# Patient Record
Sex: Male | Born: 1980 | Race: Black or African American | Hispanic: No | Marital: Married | Smoking: Never smoker
Health system: Southern US, Community
[De-identification: ages and names within clinical notes are randomized; demographics above are authoritative.]

## PROBLEM LIST (undated history)

## (undated) DIAGNOSIS — I10 Essential (primary) hypertension: Secondary | ICD-10-CM

## (undated) DIAGNOSIS — E785 Hyperlipidemia, unspecified: Secondary | ICD-10-CM

## (undated) HISTORY — DX: Hyperlipidemia, unspecified: E78.5

---
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
## 2012-08-04 ENCOUNTER — Encounter (HOSPITAL_COMMUNITY): Payer: Self-pay

## 2012-08-04 ENCOUNTER — Emergency Department (HOSPITAL_COMMUNITY)
Admission: EM | Admit: 2012-08-04 | Discharge: 2012-08-04 | Disposition: A | Discharge status: Home / Self Care | Payer: Self-pay | Attending: Emergency Medicine | Admitting: Emergency Medicine

## 2012-08-04 DIAGNOSIS — Y939 Activity, unspecified: Secondary | ICD-10-CM | POA: Insufficient documentation

## 2012-08-04 DIAGNOSIS — S20229A Contusion of unspecified back wall of thorax, initial encounter: Secondary | ICD-10-CM | POA: Insufficient documentation

## 2012-08-04 DIAGNOSIS — Y92009 Unspecified place in unspecified non-institutional (private) residence as the place of occurrence of the external cause: Secondary | ICD-10-CM | POA: Insufficient documentation

## 2012-08-04 DIAGNOSIS — W108XXA Fall (on) (from) other stairs and steps, initial encounter: Secondary | ICD-10-CM | POA: Insufficient documentation

## 2012-08-04 MED ORDER — DIAZEPAM 5 MG PO TABS: 5.0000 mg | ORAL_TABLET | Freq: Two times a day (BID) | ORAL | Status: DC

## 2012-08-04 NOTE — ED Provider Notes (Signed)
History     CSN: [id number]  Arrival date & time 08/04/12  1754   First MD Initiated Contact with Patient 08/04/12 2029      Chief Complaint  Patient presents with  . Back Pain  . Fall    (Consider location/radiation/quality/duration/timing/severity/associated sxs/prior treatment) HPI History provided by pt.   Pt slipped on wet carpet on his stairs at home yesterday afternoon, landed on his back and fell down approximately 10 steps.  Did not hit his head.  C/o soreness right mid-back.  Aggravated by movement and alleviated by valium that his wife gave him.  Non-pleuritic and no associated SOB, extremity weakness or paresthesias.   History reviewed. No pertinent past medical history.  History reviewed. No pertinent past surgical history.  History reviewed. No pertinent family history.  History  Substance Use Topics  . Smoking status: Never Smoker   . Smokeless tobacco: Not on file  . Alcohol Use: No      Review of Systems  All other systems reviewed and are negative.    Allergies  Review of patient's allergies indicates no known allergies.  Home Medications   Current Outpatient Rx  Name  Route  Sig  Dispense  Refill  . OVER THE COUNTER MEDICATION   Oral   Take 1 tablet by mouth daily as needed. For pain, muscle relaxer         . DIAZEPAM 5 MG PO TABS   Oral   Take 1 tablet (5 mg total) by mouth 2 (two) times daily.   10 tablet   0     BP 151/103  Pulse 68  Temp 98.6 F (37 C) (Oral)  Resp 18  SpO2 99%  Physical Exam  Nursing note and vitals reviewed. Constitutional: He is oriented to person, place, and time. He appears well-developed and well-nourished. No distress.  HENT:  Head: Normocephalic and atraumatic.  Eyes:       Normal appearance  Neck: Normal range of motion.  Cardiovascular: Normal rate and regular rhythm.   Pulmonary/Chest: Effort normal and breath sounds normal. No respiratory distress.  Musculoskeletal: Normal range of motion.        Entire spine non-tender.  Mild tenderness to deep palpation just inferior to right scapula.  Patient reports pain w/ movement of torso.  No pain w/ passive ROM of RUE.  Full ROM and NV intact upper and lower extremities.   Neurological: He is alert and oriented to person, place, and time.  Skin: Skin is warm and dry. No rash noted.       No ecchymosis or abrasions  Psychiatric: He has a normal mood and affect. His behavior is normal.    ED Course  Procedures (including critical care time)  Labs Reviewed - No data to display No results found.   1. Back contusion       MDM  31yo M had a mechanical fall on stairs yesterday and presents w/ mid-back soreness.  Pain non-pleuritic, no SOB, no spinal tenderness, active ROM and NV intact in all 4 extremities.  Will treat symptomatically for contusion/sprain.  Pt prescribed 10 valium.  He has 800mg  ibuprofen at home.  Recommended rest and heat/ice as well.  Return precautions discussed. 8:54 PM         Otilio Miu, PA-C 08/04/12 2054

## 2012-08-04 NOTE — ED Notes (Signed)
Pt c/o mid back pain after falling down stairs yesterday; pt denies LOC or other complaint

## 2012-08-04 NOTE — ED Notes (Signed)
Called for room  No answer  

## 2012-08-04 NOTE — ED Notes (Signed)
PA at bedside.

## 2012-08-05 NOTE — ED Provider Notes (Signed)
Medical screening examination/treatment/procedure(s) were performed by non-physician practitioner and as supervising physician I was immediately available for consultation/collaboration.  Raeford Razor, MD 08/05/12 [phone]

## 2013-01-08 ENCOUNTER — Encounter (HOSPITAL_COMMUNITY): Payer: Self-pay | Admitting: *Deleted

## 2013-01-08 ENCOUNTER — Emergency Department (INDEPENDENT_AMBULATORY_CARE_PROVIDER_SITE_OTHER)
Admission: EM | Admit: 2013-01-08 | Discharge: 2013-01-08 | Disposition: A | Discharge status: Home / Self Care | Payer: Self-pay | Source: Home / Self Care | Attending: Family Medicine | Admitting: Family Medicine

## 2013-01-08 DIAGNOSIS — M7660 Achilles tendinitis, unspecified leg: Secondary | ICD-10-CM

## 2013-01-08 DIAGNOSIS — M7661 Achilles tendinitis, right leg: Secondary | ICD-10-CM

## 2013-01-08 HISTORY — DX: Essential (primary) hypertension: I10

## 2013-01-08 MED ORDER — IBUPROFEN 600 MG PO TABS: 600.0000 mg | ORAL_TABLET | Freq: Three times a day (TID) | ORAL | Status: DC

## 2013-01-08 MED ORDER — TRAMADOL HCL 50 MG PO TABS: 50.0000 mg | ORAL_TABLET | Freq: Three times a day (TID) | ORAL | Status: DC | PRN

## 2013-01-08 NOTE — ED Notes (Signed)
Pt   Reports   r   Heel  Pain         For  sev  Months  Getting  Worse               denys  Any  specefic      Injury          The  Heel  Is  Tender  To  Touch  And  Exhibits  Pain  On  Weight  Bearing

## 2013-01-08 NOTE — ED Provider Notes (Signed)
   History    CSN: [id number] Arrival date & time 01/08/13  1004  First MD Initiated Contact with Patient 01/08/13 1050     Chief Complaint  Patient presents with  . Foot Pain   (Consider location/radiation/quality/duration/timing/severity/associated sxs/prior Treatment) HPI Comments:  32 year old male here complaining of right heel pain intermittently for the last 3 months. Patient reports his pain is worse in the last few days. States he can barely lift his foot while walking or standing. 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 Denies direct injury or falls. No prior similar symptoms. Takes over-the-counter medications intermittently with moderate improvement. Denies leg or foot swelling. No antibiotic treatments prior his symptoms started.    Past Medical History  Diagnosis Date  . Hypertension    History reviewed. No pertinent past surgical history. No family history on file. History  Substance Use Topics  . Smoking status: Never Smoker   . Smokeless tobacco: Not on file  . Alcohol Use: No    Review of Systems  Constitutional: Negative for fever, chills and fatigue.  Musculoskeletal:       As per HPI  Skin: Negative for wound.  All other systems reviewed and are negative.    Allergies  Review of patient's allergies indicates no known allergies.  Home Medications   Current Outpatient Rx  Name  Route  Sig  Dispense  Refill  . diazepam (VALIUM) 5 MG tablet   Oral   Take 1 tablet (5 mg total) by mouth 2 (two) times daily.   10 tablet   0   . ibuprofen (ADVIL,MOTRIN) 600 MG tablet   Oral   Take 1 tablet (600 mg total) by mouth 3 (three) times daily.   30 tablet   0   . OVER THE COUNTER MEDICATION   Oral   Take 1 tablet by mouth daily as needed. For pain, muscle relaxer         . traMADol (ULTRAM) 50 MG tablet   Oral   Take 1 tablet (50 mg total) by mouth every 8 (eight) hours as needed for pain.   20  tablet   0    BP 124/76  Pulse 70  Temp(Src) 97.9 F (36.6 C) (Oral)  Resp 16  SpO2 100% Physical Exam  Nursing note and vitals reviewed. Constitutional: He is oriented to person, place, and time. He appears well-developed and well-nourished. No distress.  HENT:  Head: Normocephalic and atraumatic.  Cardiovascular: Normal heart sounds.   Pulmonary/Chest: Breath sounds normal.  Musculoskeletal:  Right foot: No swelling or obvious deformity. There is focal tenderness over achilles tendon about 6 cm above heel tuberosity. Achilles tendon feels slightly bulky. No nodules or cysts. Negative Thompson's sign. Pain worse with foot dorsiflexion.    Neurological: He is alert and oriented to person, place, and time.  Skin: No rash noted. He is not diaphoretic.    ED Course  Procedures (including critical care time) Labs Reviewed - No data to display No results found. 1. Achilles tendinitis, right     MDM  Treated with Achilles tendon immobilization with Ace wrap and ankle brace. Prescribed Ibuprofen and tramadol. Supportive care including rehabilitation exercises and red flags that should prompt his return to medical attention discussed with patient and provided in writing. Sports medicine referral as needed.  Sharin Grave, MD 01/09/13 [phone]

## 2013-01-12 ENCOUNTER — Ambulatory Visit (INDEPENDENT_AMBULATORY_CARE_PROVIDER_SITE_OTHER): Payer: Self-pay | Admitting: Family Medicine

## 2013-01-12 ENCOUNTER — Encounter: Payer: Self-pay | Admitting: Family Medicine

## 2013-01-12 ENCOUNTER — Other Ambulatory Visit: Payer: Self-pay | Admitting: Family Medicine

## 2013-01-12 VITALS — BP 128/90 | HR 74 | Ht 70.0 in | Wt 215.0 lb

## 2013-01-12 DIAGNOSIS — M7661 Achilles tendinitis, right leg: Secondary | ICD-10-CM

## 2013-01-12 DIAGNOSIS — M7660 Achilles tendinitis, unspecified leg: Secondary | ICD-10-CM

## 2013-01-12 MED ORDER — NITROGLYCERIN 0.2 MG/HR TD PT24: MEDICATED_PATCH | TRANSDERMAL | Status: DC

## 2013-01-12 NOTE — Patient Instructions (Addendum)
You have achilles tendinopathy Aleve 2 tabs twice a day with food OR ibuprofen 3 tabs three times a day with food for pain and inflammation. Lowering/raise on a step exercises 3 x 10 once or twice a day - start doing them on level ground first though. Stretch against the wall as I showed you - hold for 20 seconds and repeat 3 times. Can add heel walks, toe walks forward and backward as well Icing 15 minutes at a time 3-4 times a day. Avoid uneven ground, hills as much as possible. Heel lifts are helpful. Consider orthotics (dr. Jari Sportsman active series insoles) for arch supports which may help. Consider physical therapy and/or nitro patches if not improving as expected Follow up with me in 5-6 weeks for reevaluation.

## 2013-01-13 ENCOUNTER — Encounter: Payer: Self-pay | Admitting: Family Medicine

## 2013-01-13 DIAGNOSIS — M7660 Achilles tendinitis, unspecified leg: Secondary | ICD-10-CM | POA: Insufficient documentation

## 2013-01-13 NOTE — Progress Notes (Signed)
Patient ID: Ian Thomas, male   DOB: Oct 13, 1980, 32 y.o.   MRN: [id number]  PCP: No PCP Per Patient  Subjective:   HPI: Patient is a 32 y.o. male here for right ankle pain.  Patient denies known injury. States about 3 months ago at work started to notice posterior right ankle pain. Some radiation up leg as well. Works 10-12 hours a day on his feet. Worse by end of day. Tried icing. In [facility] was given postop shoe, ace wrap, ASO which he has been using. Taking ibuprofen as well. No prior issues.  Past Medical History  Diagnosis Date  . Hypertension     Current Outpatient Prescriptions on File Prior to Visit  Medication Sig Dispense Refill  . ibuprofen (ADVIL,MOTRIN) 600 MG tablet Take 1 tablet (600 mg total) by mouth 3 (three) times daily.  30 tablet  0  . traMADol (ULTRAM) 50 MG tablet Take 1 tablet (50 mg total) by mouth every 8 (eight) hours as needed for pain.  20 tablet  0   No current facility-administered medications on file prior to visit.    History reviewed. No pertinent past surgical history.  No Known Allergies  History   Social History  . Marital Status: Married    Spouse Name: N/A    Number of Children: N/A  . Years of Education: N/A   Occupational History  . Not on file.   Social History Main Topics  . Smoking status: Never Smoker   . Smokeless tobacco: Not on file  . Alcohol Use: No  . Drug Use: No  . Sexually Active: Not on file   Other Topics Concern  . Not on file   Social History Narrative  . No narrative on file    Family History  Problem Relation Age of Onset  . Hypertension Mother   . Hypertension Father   . Diabetes Sister   . Heart attack Neg Hx   . Hyperlipidemia Neg Hx   . Sudden death Neg Hx     BP 128/90  Pulse 74  Ht 5\' 10"  (1.778 m)  Wt 215 lb (97.523 kg)  BMI 30.85 kg/m2  Review of Systems: See HPI above.    Objective:  Physical Exam:  Gen: NAD  R ankle: No gross deformity, swelling, ecchymoses FROM  ankle with pain in achilles with all motions TTP at achilles insertion up to musculotendinous junction.  No PF, malleolar, other foot/ankle TTP. Negative ant drawer and talar tilt.   Negative syndesmotic compression. Thompsons test negative. NV intact distally. Pes planus  Assessment & Plan:  1. Right achilles tendinopathy - start home exercises which were shown today.  He would like to do formal physical therapy as well so written for this.  Icing, tylenol/nsaids as needed.  Avoid uneven ground, hills.  Heel lifts and better arch supports.  Out of work for 1 week then placed on light duty until f/u in about 6 weeks.

## 2013-01-13 NOTE — Assessment & Plan Note (Signed)
Right achilles tendinopathy - start home exercises which were shown today.  He would like to do formal physical therapy as well so written for this.  Icing, tylenol/nsaids as needed.  Avoid uneven ground, hills.  Heel lifts and better arch supports.  Out of work for 1 week then placed on light duty until f/u in about 6 weeks.

## 2013-01-15 ENCOUNTER — Encounter: Payer: Self-pay | Admitting: Family Medicine

## 2014-04-20 ENCOUNTER — Encounter (HOSPITAL_COMMUNITY): Payer: Self-pay | Admitting: Emergency Medicine

## 2014-04-20 ENCOUNTER — Emergency Department (HOSPITAL_COMMUNITY)
Admission: EM | Admit: 2014-04-20 | Discharge: 2014-04-20 | Disposition: A | Discharge status: Home / Self Care | Payer: Self-pay | Attending: Emergency Medicine | Admitting: Emergency Medicine

## 2014-04-20 DIAGNOSIS — R51 Headache: Secondary | ICD-10-CM | POA: Insufficient documentation

## 2014-04-20 DIAGNOSIS — I10 Essential (primary) hypertension: Secondary | ICD-10-CM

## 2014-04-20 DIAGNOSIS — R519 Headache, unspecified: Secondary | ICD-10-CM

## 2014-04-20 DIAGNOSIS — Z79899 Other long term (current) drug therapy: Secondary | ICD-10-CM | POA: Insufficient documentation

## 2014-04-20 LAB — CBC WITH DIFFERENTIAL/PLATELET
BASOS PCT: 1 % (ref 0–1)
Basophils Absolute: 0 10*3/uL (ref 0.0–0.1)
EOS ABS: 0.1 10*3/uL (ref 0.0–0.7)
Eosinophils Relative: 2 % (ref 0–5)
HCT: 41.5 % (ref 39.0–52.0)
HEMOGLOBIN: 14.6 g/dL (ref 13.0–17.0)
Lymphocytes Relative: 31 % (ref 12–46)
Lymphs Abs: 1.1 10*3/uL (ref 0.7–4.0)
MCH: 31.9 pg (ref 26.0–34.0)
MCHC: 35.2 g/dL (ref 30.0–36.0)
MCV: 90.6 fL (ref 78.0–100.0)
MONOS PCT: 10 % (ref 3–12)
Monocytes Absolute: 0.3 10*3/uL (ref 0.1–1.0)
NEUTROS ABS: 1.9 10*3/uL (ref 1.7–7.7)
NEUTROS PCT: 56 % (ref 43–77)
PLATELETS: 177 10*3/uL (ref 150–400)
RBC: 4.58 MIL/uL (ref 4.22–5.81)
RDW: 13.5 % (ref 11.5–15.5)
WBC: 3.4 10*3/uL — ABNORMAL LOW (ref 4.0–10.5)

## 2014-04-20 LAB — BASIC METABOLIC PANEL
ANION GAP: 11 (ref 5–15)
BUN: 7 mg/dL (ref 6–23)
CHLORIDE: 99 meq/L (ref 96–112)
CO2: 27 mEq/L (ref 19–32)
Calcium: 9.5 mg/dL (ref 8.4–10.5)
Creatinine, Ser: 0.97 mg/dL (ref 0.50–1.35)
Glucose, Bld: 96 mg/dL (ref 70–99)
POTASSIUM: 4.1 meq/L (ref 3.7–5.3)
SODIUM: 137 meq/L (ref 137–147)

## 2014-04-20 MED ORDER — AMLODIPINE BESYLATE 10 MG PO TABS: 10.0000 mg | ORAL_TABLET | Freq: Every day | ORAL | Status: DC

## 2014-04-20 MED ORDER — AMLODIPINE BESYLATE 10 MG PO TABS
10.0000 mg | ORAL_TABLET | Freq: Once | ORAL | Status: AC
  Administered 2014-04-20: 10 mg via ORAL
  Filled 2014-04-20: qty 1

## 2014-04-20 MED ORDER — IBUPROFEN 200 MG PO TABS
600.0000 mg | ORAL_TABLET | Freq: Once | ORAL | Status: AC
  Administered 2014-04-20: 600 mg via ORAL
  Filled 2014-04-20: qty 3

## 2014-04-20 NOTE — Discharge Instructions (Signed)

## 2014-04-20 NOTE — ED Notes (Signed)
Pt c/o headache; history of hypertension; pt has been out of amlodipine x 1 month due to recent move and not having a primary care physician;  Pt states at times has blurred vision and nausea at times

## 2014-04-20 NOTE — ED Notes (Signed)
Pt reports blurred vision and nausea yesterday. Pt reports headache of temporal region bilaterally at present time. Pt hx of hypertension without medication regimen over past month. Pt neuro unremarkable.

## 2014-04-20 NOTE — ED Provider Notes (Signed)
CSN: [id number]     Arrival date & time 04/20/14  1202 History   First MD Initiated Contact with Patient 04/20/14 1256     Chief Complaint  Patient presents with  . Hypertension     (Consider location/radiation/quality/duration/timing/severity/associated sxs/prior Treatment) HPI  This is a 33 year old male with a history of hypertension who presents with hypertension and headache. Patient reports that he's been out of his blood pressure medications for one month. He is new to the area and does not have primary Dr. Patient normally takes amlodipine 10 mg daily. He states that his blood pressures at home have been as high as 195/117. He states that when his blood pressures get high he develops headache and blurry vision. Headaches come on gradually. Currently headache is 8/10. He denies acute onset of headache. At this time he denies any vision changes. He denies any weakness, numbness, tingling. He denies any neck this or fevers. He denies any chest pain or shortness of breath.  Past Medical History  Diagnosis Date  . Hypertension    History reviewed. No pertinent past surgical history. Family History  Problem Relation Age of Onset  . Hypertension Mother   . Hypertension Father   . Diabetes Sister   . Heart attack Neg Hx   . Hyperlipidemia Neg Hx   . Sudden death Neg Hx    History  Substance Use Topics  . Smoking status: Never Smoker   . Smokeless tobacco: Not on file  . Alcohol Use: No    Review of Systems  Constitutional: Negative.  Negative for fever.  Respiratory: Negative.  Negative for chest tightness and shortness of breath.   Cardiovascular: Negative.  Negative for chest pain.  Gastrointestinal: Negative.  Negative for abdominal pain.  Genitourinary: Negative.  Negative for dysuria.  Neurological: Positive for headaches. Negative for dizziness, seizures, weakness and light-headedness.  All other systems reviewed and are negative.     Allergies  Review of  patient's allergies indicates no known allergies.  Home Medications   Prior to Admission medications   Medication Sig Start Date End Date Taking? Authorizing Provider  ibuprofen (ADVIL,MOTRIN) 800 MG tablet Take 800 mg by mouth every 8 (eight) hours as needed for headache or moderate pain.   Yes Historical Provider, MD  amLODipine (NORVASC) 10 MG tablet Take 1 tablet (10 mg total) by mouth daily. 04/20/14   Shon Batonourtney F Tyniesha Howald, MD   BP 169/101  Pulse 76  Temp(Src) 98.1 F (36.7 C) (Oral)  Resp 16  SpO2 100% Physical Exam  Nursing note and vitals reviewed. Constitutional: He is oriented to person, place, and time. He appears well-developed and well-nourished.  HENT:  Head: Normocephalic and atraumatic.  Eyes: EOM are normal. Pupils are equal, round, and reactive to light.  Neck: Neck supple.  Cardiovascular: Normal rate, regular rhythm and normal heart sounds.   No murmur heard. Pulmonary/Chest: Effort normal and breath sounds normal. No respiratory distress. He has no wheezes.  Abdominal: Soft. Bowel sounds are normal. There is no tenderness. There is no rebound.  Musculoskeletal: He exhibits no edema.  Neurological: He is alert and oriented to person, place, and time.  Skin: Skin is warm and dry.  Psychiatric: He has a normal mood and affect.    ED Course  Procedures (including critical care time) Labs Review Labs Reviewed  CBC WITH DIFFERENTIAL - Abnormal; Notable for the following:    WBC 3.4 (*)    All other components within normal limits  BASIC METABOLIC PANEL  Imaging Review No results found.   EKG Interpretation   Date/Time:  Tuesday April 20 2014 13:45:37 EDT Ventricular Rate:  80 PR Interval:  159 QRS Duration: 79 QT Interval:  351 QTC Calculation: 405 R Axis:   57 Text Interpretation:  Sinus rhythm Probable left atrial enlargement  Borderline T abnormalities, inferior leads No prior for comparison  Confirmed by Lorenda Grecco  MD, Shaelee Forni ([id number]) on  04/20/2014 7:03:46 PM      MDM   Final diagnoses:  Essential hypertension  Acute nonintractable headache, unspecified headache type   Patient presents with hypertension. Reports episodic headaches when blood pressures get high. He has been off his blood pressure medications. States that his headache is typical for his associated blood pressure headaches. He is nonfocal on exam. Doubt subarachnoid or infectious etiology. Patient was given one dose of amlodipine and ibuprofen. Reports improvement of symptoms. Basic labwork and EKG obtained and largely reassuring. We'll place back on prior dose of amlodipine. Patient was given [facility] followup.  No evidence of hypertensive urgency or emergency at this time.  After history, exam, and medical workup I feel the patient has been appropriately medically screened and is safe for discharge home. Pertinent diagnoses were discussed with the patient. Patient was given return precautions.    Shon Batonourtney F Dulcy Sida, MD 04/21/14 [phone]

## 2014-04-20 NOTE — Progress Notes (Signed)
  CARE MANAGEMENT ED NOTE 04/20/2014  Patient:  Ian NasutiGRIFFIN,Ian   Account Number:  [id number]  Date Initiated:  04/20/2014  Documentation initiated by:  Edd ArbourGIBBS,KIMBERLY  Subjective/Objective Assessment:   33 yr [place] wife at bedside c/o headache; history of hypertension; pt has been out of amlodipine x 1 month due to recent move and not having a primary care physician;  Pt states at times has blurred     Subjective/Objective Assessment Detail:   vision and nausea at times  confirms no pcp  Pt very pleasant and polite  BP 175/100  good rx self pay cost for norvasc 10 mg 30 day supply $6.64 ([facility]) to $26.65 ([facility])     Action/Plan:   CM spoke with pt about self pay pcps services [facility] services Provided below resources and encourage questions   Action/Plan Detail:   Anticipated DC Date:  04/20/2014     Status Recommendation to Physician:   Result of Recommendation:    Other ED Services  Consult Working Plan    DC Planning Services  Other  Outpatient Services - Pt will follow up  PCP issues  [facility] / [facility] (established/new)    Choice offered to / List presented to:            Status of service:  Completed, signed off  ED Comments:   ED Comments Detail:  CM spoke with pt who [place]. CM discussed and provided written information for self pay pcps, importance of pcp for f/u care, www.needymeds.org, discounted pharmacies and other [place] resources such as [facility], [facility], affordable care act,  financial assistance, DSS and  health department Reviewed resources for [place] pcps like [facility], family medicine at [address], [facility], general medical clinics, [facility] plus others, medication resources, [facility] out patient pharmacies and housing Pt voiced understanding and appreciation of resources provided  Provided [facility] contact information Referral completed to  [facility] Pending call to pt

## 2014-11-01 ENCOUNTER — Observation Stay (HOSPITAL_COMMUNITY): Payer: Medicaid Other

## 2014-11-01 ENCOUNTER — Emergency Department (HOSPITAL_COMMUNITY): Payer: Medicaid Other

## 2014-11-01 ENCOUNTER — Observation Stay (HOSPITAL_COMMUNITY)
Admission: EM | Admit: 2014-11-01 | Discharge: 2014-11-03 | Disposition: A | Discharge status: Home / Self Care | Payer: Medicaid Other | Attending: Internal Medicine | Admitting: Internal Medicine

## 2014-11-01 ENCOUNTER — Encounter (HOSPITAL_COMMUNITY): Payer: Self-pay | Admitting: *Deleted

## 2014-11-01 DIAGNOSIS — R4781 Slurred speech: Secondary | ICD-10-CM | POA: Diagnosis not present

## 2014-11-01 DIAGNOSIS — R29898 Other symptoms and signs involving the musculoskeletal system: Secondary | ICD-10-CM | POA: Diagnosis present

## 2014-11-01 DIAGNOSIS — Z79899 Other long term (current) drug therapy: Secondary | ICD-10-CM | POA: Insufficient documentation

## 2014-11-01 DIAGNOSIS — R531 Weakness: Secondary | ICD-10-CM | POA: Insufficient documentation

## 2014-11-01 DIAGNOSIS — R519 Headache, unspecified: Secondary | ICD-10-CM

## 2014-11-01 DIAGNOSIS — I10 Essential (primary) hypertension: Principal | ICD-10-CM

## 2014-11-01 DIAGNOSIS — R29810 Facial weakness: Secondary | ICD-10-CM | POA: Diagnosis not present

## 2014-11-01 DIAGNOSIS — Z9114 Patient's other noncompliance with medication regimen: Secondary | ICD-10-CM | POA: Insufficient documentation

## 2014-11-01 DIAGNOSIS — R51 Headache: Secondary | ICD-10-CM

## 2014-11-01 DIAGNOSIS — G8220 Paraplegia, unspecified: Secondary | ICD-10-CM

## 2014-11-01 LAB — COMPREHENSIVE METABOLIC PANEL
ALBUMIN: 4.5 g/dL (ref 3.5–5.0)
ALT: 79 U/L — ABNORMAL HIGH (ref 17–63)
ANION GAP: 9 (ref 5–15)
AST: 39 U/L (ref 15–41)
Alkaline Phosphatase: 70 U/L (ref 38–126)
BUN: 13 mg/dL (ref 6–20)
CALCIUM: 10.2 mg/dL (ref 8.9–10.3)
CO2: 28 mmol/L (ref 22–32)
Chloride: 100 mmol/L — ABNORMAL LOW (ref 101–111)
Creatinine, Ser: 1.17 mg/dL (ref 0.61–1.24)
GFR calc non Af Amer: >60 mL/min (ref >60)
Glucose, Bld: 115 mg/dL — ABNORMAL HIGH (ref 70–99)
Potassium: 4.4 mmol/L (ref 3.5–5.1)
SODIUM: 137 mmol/L (ref 135–145)
TOTAL PROTEIN: 8.8 g/dL — AB (ref 6.5–8.1)
Total Bilirubin: 0.8 mg/dL (ref 0.3–1.2)

## 2014-11-01 LAB — CBC
HCT: 45 % (ref 39.0–52.0)
Hemoglobin: 15.2 g/dL (ref 13.0–17.0)
MCH: 30.5 pg (ref 26.0–34.0)
MCHC: 33.8 g/dL (ref 30.0–36.0)
MCV: 90.4 fL (ref 78.0–100.0)
PLATELETS: 227 10*3/uL (ref 150–400)
RBC: 4.98 MIL/uL (ref 4.22–5.81)
RDW: 13.7 % (ref 11.5–15.5)
WBC: 3 10*3/uL — ABNORMAL LOW (ref 4.0–10.5)

## 2014-11-01 LAB — CBG MONITORING, ED: Glucose-Capillary: 106 mg/dL — ABNORMAL HIGH (ref 70–99)

## 2014-11-01 LAB — URINALYSIS, ROUTINE W REFLEX MICROSCOPIC
BILIRUBIN URINE: NEGATIVE
GLUCOSE, UA: NEGATIVE mg/dL
Hgb urine dipstick: NEGATIVE
Ketones, ur: NEGATIVE mg/dL
Leukocytes, UA: NEGATIVE
Nitrite: NEGATIVE
PH: 6.5 (ref 5.0–8.0)
PROTEIN: NEGATIVE mg/dL
SPECIFIC GRAVITY, URINE: 1.012 (ref 1.005–1.030)
Urobilinogen, UA: 0.2 mg/dL (ref 0.0–1.0)

## 2014-11-01 LAB — DIFFERENTIAL
Basophils Absolute: 0 10*3/uL (ref 0.0–0.1)
Basophils Relative: 0 % (ref 0–1)
EOS ABS: 0.1 10*3/uL (ref 0.0–0.7)
EOS PCT: 3 % (ref 0–5)
Lymphocytes Relative: 37 % (ref 12–46)
Lymphs Abs: 1.1 10*3/uL (ref 0.7–4.0)
MONO ABS: 0.3 10*3/uL (ref 0.1–1.0)
Monocytes Relative: 10 % (ref 3–12)
NEUTROS ABS: 1.5 10*3/uL — AB (ref 1.7–7.7)
NEUTROS PCT: 50 % (ref 43–77)

## 2014-11-01 LAB — I-STAT TROPONIN, ED: Troponin i, poc: 0 ng/mL (ref 0.00–0.08)

## 2014-11-01 LAB — TSH: TSH: 3.025 u[IU]/mL (ref 0.350–4.500)

## 2014-11-01 MED ORDER — LOSARTAN POTASSIUM 50 MG PO TABS
100.0000 mg | ORAL_TABLET | Freq: Once | ORAL | Status: AC
  Administered 2014-11-01: 100 mg via ORAL
  Filled 2014-11-01 (×2): qty 2

## 2014-11-01 MED ORDER — HYDROCHLOROTHIAZIDE 25 MG PO TABS
25.0000 mg | ORAL_TABLET | Freq: Once | ORAL | Status: AC
  Administered 2014-11-01: 25 mg via ORAL
  Filled 2014-11-01: qty 1

## 2014-11-01 MED ORDER — LOSARTAN POTASSIUM-HCTZ 100-25 MG PO TABS: 1.0000 | ORAL_TABLET | Freq: Once | ORAL | Status: DC

## 2014-11-01 MED ORDER — AMLODIPINE BESYLATE 10 MG PO TABS
10.0000 mg | ORAL_TABLET | Freq: Once | ORAL | Status: AC
  Administered 2014-11-01: 10 mg via ORAL
  Filled 2014-11-01: qty 1

## 2014-11-01 MED ORDER — HEPARIN SODIUM (PORCINE) 5000 UNIT/ML IJ SOLN
5000.0000 [IU] | Freq: Three times a day (TID) | INTRAMUSCULAR | Status: DC
  Administered 2014-11-02 (×4): 5000 [IU] via SUBCUTANEOUS
  Filled 2014-11-01 (×6): qty 1

## 2014-11-01 NOTE — ED Notes (Signed)
Pt c/o weakness, headache since Thursday.  Wife states pt was slurring words and  L side was drooping and his blood pressure was 194/128.  A neighbor gave them some bp meds, which after he took them, his symptoms resolved.  Pt has been taking neighbors bp meds since then, but did not take today.  BP 132/94.  Pt did take bp meds previously, but does not have insurance.

## 2014-11-01 NOTE — H&P (Signed)
[facility] History and Physical  Ian Thomas XBJ:[id number] DOB: 1980-11-02 DOA: 11/01/2014  Referring physician: EDP PCP: No PCP Per Patient   Chief Complaint: Weakness   HPI: Ian Thomas is a 34 y.o. male who presents to the ED with c/o weakness and headache since Thursday of last week.  Patient initially had unilateral symptoms including facial droop.  These resolved completely after he took BP meds (has h/o HTN has been off of BP meds due to insurance).  After resolving however, he has become increasingly weak to the point where he requires assistance with walking.  Weakness is generalized, especially in legs, and thighs.  Review of Systems: Systems reviewed.  As above, otherwise negative  Past Medical History  Diagnosis Date  . Hypertension    History reviewed. No pertinent past surgical history. Social History:  reports that he has never smoked. He does not have any smokeless tobacco history on file. He reports that he does not drink alcohol or use illicit drugs.  No Known Allergies  Family History  Problem Relation Age of Onset  . Hypertension Mother   . Hypertension Father   . Diabetes Sister   . Heart attack Neg Hx   . Hyperlipidemia Neg Hx   . Sudden death Neg Hx      Prior to Admission medications   Medication Sig Start Date End Date Taking? Authorizing Provider  amLODipine (NORVASC) 10 MG tablet Take 10 mg by mouth once.   Yes Historical Provider, MD  ibuprofen (ADVIL,MOTRIN) 800 MG tablet Take 800 mg by mouth every 8 (eight) hours as needed for headache or moderate pain.   Yes Historical Provider, MD  losartan-hydrochlorothiazide (HYZAAR) 100-25 MG per tablet Take 1 tablet by mouth once.   Yes Historical Provider, MD   Physical Exam: Filed Vitals:   11/01/14 2045  BP: 122/74  Pulse: 95  Temp:   Resp: 14    BP 122/74 mmHg  Pulse 95  Temp(Src) 98.2 F (36.8 C) (Oral)  Resp 14  Ht 5\' 10"  (1.778 m)  Wt 99.338 kg (219 lb)  BMI 31.42 kg/m2   SpO2 95%  General Appearance:    Alert, oriented, no distress, appears stated age  Head:    Normocephalic, atraumatic  Eyes:    PERRL, EOMI, sclera non-icteric        Nose:   Nares without drainage or epistaxis. Mucosa, turbinates normal  Throat:   Moist mucous membranes. Oropharynx without erythema or exudate.  Neck:   Supple. No carotid bruits.  No thyromegaly.  No lymphadenopathy.   Back:     No CVA tenderness, no spinal tenderness  Lungs:     Clear to auscultation bilaterally, without wheezes, rhonchi or rales  Chest wall:    No tenderness to palpitation  Heart:    Regular rate and rhythm without murmurs, gallops, rubs  Abdomen:     Soft, non-tender, nondistended, normal bowel sounds, no organomegaly  Genitalia:    deferred  Rectal:    deferred  Extremities:   No clubbing, cyanosis or edema.  Pulses:   2+ and symmetric all extremities  Skin:   Skin color, texture, turgor normal, no rashes or lesions  Lymph nodes:   Cervical, supraclavicular, and axillary nodes normal  Neurologic:   CNII-XII intact. Normal sensation and reflexes      Throughout, strength is diminished, especially in proximal muscles of BLE    Labs on Admission:  Basic Metabolic Panel:  Recent Labs Lab 11/01/14 1140  NA 137  K 4.4  CL 100*  CO2 28  GLUCOSE 115*  BUN 13  CREATININE 1.17  CALCIUM 10.2   Liver Function Tests:  Recent Labs Lab 11/01/14 1140  AST 39  ALT 79*  ALKPHOS 70  BILITOT 0.8  PROT 8.8*  ALBUMIN 4.5   No results for input(s): LIPASE, AMYLASE in the last 168 hours. No results for input(s): AMMONIA in the last 168 hours. CBC:  Recent Labs Lab 11/01/14 1140  WBC 3.0*  NEUTROABS 1.5*  HGB 15.2  HCT 45.0  MCV 90.4  PLT 227   Cardiac Enzymes: No results for input(s): CKTOTAL, CKMB, CKMBINDEX, TROPONINI in the last 168 hours.  BNP (last 3 results) No results for input(s): PROBNP in the last 8760 hours. CBG:  Recent Labs Lab 11/01/14 1142  GLUCAP 106*     Radiological Exams on Admission: Dg Chest 2 View  11/01/2014   CLINICAL DATA:  Chest pain, shortness of breath, headache, weakness and dizziness for 4 days, history hypertension  EXAM: CHEST  2 VIEW  COMPARISON:  None  FINDINGS: Normal heart size, mediastinal contours, and pulmonary vascularity.  Lungs clear.  No pleural effusion or pneumothorax.  Minimal levoconvex scoliosis of the upper thoracic spine.  IMPRESSION: No acute abnormalities.   Electronically Signed   By: Ulyses Southward M.D.   On: 11/01/2014 16:06   Ct Head Wo Contrast  11/01/2014   CLINICAL DATA:  Generalized weakness, LEFT side extremity weakness and slurring words since Thursday, history hypertension  EXAM: CT HEAD WITHOUT CONTRAST  TECHNIQUE: Contiguous axial images were obtained from the base of the skull through the vertex without intravenous contrast.  COMPARISON:  None  FINDINGS: Normal ventricular morphology.  No midline shift or mass effect.  Normal appearance of brain parenchyma.  No intracranial hemorrhage, mass lesion, or acute infarction.  Visualized paranasal sinuses and mastoid air cells clear.  Bones unremarkable.  IMPRESSION: Normal exam.   Electronically Signed   By: Ulyses Southward M.D.   On: 11/01/2014 17:16   [name] Contrast  11/01/2014   CLINICAL DATA:  Weakness and headache. Slurred speech and left-sided droop.  EXAM: MRI HEAD WITHOUT CONTRAST  TECHNIQUE: Multiplanar, multiecho pulse sequences of the brain and surrounding structures were obtained without intravenous contrast.  COMPARISON:  Head CT earlier today  FINDINGS: There is no acute infarct. Ventricles and sulci are normal for age. There is no evidence of intracranial hemorrhage, mass, midline shift, or extra-axial fluid collection. No brain parenchymal signal abnormality is identified.  Orbits are unremarkable. Minimal left maxillary sinus mucosal thickening is noted. No significant mastoid effusion. Major intracranial vascular flow voids are preserved.  Calvarium and scalp soft tissues are unremarkable.  IMPRESSION: Unremarkable appearance of the brain.   Electronically Signed   By: Sebastian Ache   On: 11/01/2014 20:07    EKG: Independently reviewed.  Assessment/Plan Active Problems:   Weakness of both legs   1. BLE weakness - unclear etiology 1. CPK ordered though i doubt this will be elevated given normal UA 2. MRI T and L spine (though he has no urinary symptoms c/w cauda equina) 3. Neurology has seen patient at bedside and recommended the T and L spine MRI 4. Checking Mg 5. PT/OT    Code Status: Full code  Family Communication: Family at bedside Disposition Plan: Admit to obs   Time spent: 50 min  Shaquaya Wuellner M. [facility] Pager [phone]  If 7AM-7PM, please contact the day team taking care of the  patient [email or web address] Password [email or web address] 11/01/2014, 9:24 PM

## 2014-11-01 NOTE — ED Provider Notes (Signed)
CSN: [id number]     Arrival date & time 11/01/14  1047 History   None    Chief Complaint  Patient presents with  . Headache   (Consider location/radiation/quality/duration/timing/severity/associated sxs/prior Treatment) Patient is a 34 y.o. male presenting with headaches. The history is provided by the patient and the spouse. No language interpreter was used.  Headache Pain location:  Generalized Quality:  Dull Radiates to:  Does not radiate Severity currently:  8/10 Severity at highest:  8/10 Onset quality:  Gradual Timing:  Intermittent Progression:  Waxing and waning Chronicity:  Recurrent Similar to prior headaches: no   Context: not activity, not coughing, not defecating and not exposure to cold air   Relieved by:  Resting in a darkened room Ineffective treatments:  None tried Associated symptoms: dizziness, fatigue, focal weakness, syncope and weakness   Associated symptoms: no abdominal pain, no back pain, no blurred vision, no cough, no fever, no nausea, no near-syncope, no numbness, no paresthesias, no seizures and no vomiting   Risk factors: no family hx of SAH and lifestyle not sedentary     Past Medical History  Diagnosis Date  . Hypertension    History reviewed. No pertinent past surgical history. Family History  Problem Relation Age of Onset  . Hypertension Mother   . Hypertension Father   . Diabetes Sister   . Heart attack Neg Hx   . Hyperlipidemia Neg Hx   . Sudden death Neg Hx    History  Substance Use Topics  . Smoking status: Never Smoker   . Smokeless tobacco: Not on file  . Alcohol Use: No    Review of Systems  Constitutional: Positive for fatigue. Negative for fever and diaphoresis.  Eyes: Negative for blurred vision.  Respiratory: Negative for cough, chest tightness, shortness of breath and wheezing.   Cardiovascular: Positive for syncope. Negative for chest pain, palpitations and near-syncope.  Gastrointestinal: Negative for nausea,  vomiting and abdominal pain.  Musculoskeletal: Positive for gait problem (2/2 weakness). Negative for back pain.  Neurological: Positive for dizziness, focal weakness, syncope, facial asymmetry, speech difficulty, weakness and headaches. Negative for tremors, seizures, light-headedness, numbness and paresthesias.  Psychiatric/Behavioral: Negative for confusion.  All other systems reviewed and are negative.     Allergies  Review of patient's allergies indicates no known allergies.  Home Medications   Prior to Admission medications   Medication Sig Start Date End Date Taking? Authorizing Provider  amLODipine (NORVASC) 10 MG tablet Take 1 tablet (10 mg total) by mouth daily. 04/20/14   Shon Batonourtney F Horton, MD  ibuprofen (ADVIL,MOTRIN) 800 MG tablet Take 800 mg by mouth every 8 (eight) hours as needed for headache or moderate pain.    Historical Provider, MD   BP 132/94 mmHg  Pulse 85  Temp(Src) 99 F (37.2 C) (Oral)  Resp 16  Ht 5\' 10"  (1.778 m)  Wt 219 lb (99.338 kg)  BMI 31.42 kg/m2  SpO2 97% Physical Exam  Constitutional: He is oriented to person, place, and time. He appears well-developed and well-nourished. No distress.  HENT:  Head: Normocephalic and atraumatic.  Nose: Nose normal.  Mouth/Throat: Oropharynx is clear and moist. No oropharyngeal exudate.  Eyes: EOM are normal. Pupils are equal, round, and reactive to light.  Neck: Normal range of motion. Neck supple.  Cardiovascular: Normal rate, regular rhythm, normal heart sounds and intact distal pulses.   No murmur heard. Pulmonary/Chest: Effort normal and breath sounds normal. No respiratory distress. He has no wheezes. He exhibits no tenderness.  Abdominal: Soft. He exhibits no distension. There is no tenderness. There is no guarding.  Musculoskeletal: Normal range of motion. He exhibits no tenderness.  Neurological: He is alert and oriented to person, place, and time. No cranial nerve deficit. Coordination normal.   Awake/alert.  No facial droop, clear/fluent speech, fully oriented.   4/5 strength in bilateral lower extremities diffusely, and fatigues easily.  Slow to move and change positions which appears to be 2/2 weakness.  Gives good effort.  Upper extremities with 5/5 strength on individual testing, but again slow to use/move 2/2 sensation of weakness.  Normal coordination   Skin: Skin is warm and dry. He is not diaphoretic. No pallor.  Psychiatric: He has a normal mood and affect. His behavior is normal. Judgment and thought content normal.  Nursing note and vitals reviewed.   ED Course  Procedures (including critical care time) Labs Review Labs Reviewed  CBC - Abnormal; Notable for the following:    WBC 3.0 (*)    All other components within normal limits  DIFFERENTIAL - Abnormal; Notable for the following:    Neutro Abs 1.5 (*)    All other components within normal limits  COMPREHENSIVE METABOLIC PANEL - Abnormal; Notable for the following:    Chloride 100 (*)    Glucose, Bld 115 (*)    Total Protein 8.8 (*)    ALT 79 (*)    All other components within normal limits  CBG MONITORING, ED - Abnormal; Notable for the following:    Glucose-Capillary 106 (*)    All other components within normal limits    Imaging Review No results found.   EKG Interpretation   Date/Time:  Monday Nov 01 2014 17:42:42 EDT Ventricular Rate:  85 PR Interval:  157 QRS Duration: 88 QT Interval:  381 QTC Calculation: 453 R Axis:   52 Text Interpretation:  Sinus rhythm Normal ECG Confirmed by Anitra Lauth  MD,  Alphonzo Lemmings ([id number]) on 11/01/2014 6:34:55 PM      MDM   Final diagnoses:  Generalized headache  Generalized weakness  Essential hypertension  H/O medication noncompliance   Pt is a 34 yo M with no significant PMH who presents with a headache for several days associated with several neuro complaints. Headache that has waxed/waned for 8 days, but has been present daily.  Throbbing, dull headache  all over head.  No previous hx of headaches.  No fevers, no neck pain.   Complains of severe left facial droop and left arm/leg weakness, dysarthria, and confusion on Thursday.  Wasn't able to speak.  Reports dysphasia, dysarthria, and expressive aphasia that day.  Went to sleep and the sx resolved after a several hour nap.   Then had a syncopal episode Friday where he fell to the ground after a prodrome of lightheadedness and darkening vision.  Denies chest pain at that time. Was after standing in the shower/changing back into clothes but he denies exertion or acute position change prior to the episode.  Denies significant heat in the shower.  Now complains of severe generalized weakness/fatigue, unable to get out of bed/ambulate without assistance due to diffuse weakness.  Previously was able to lift weights daily and lead an active life.   Reports he has not been taking BP meds as prescribed for the past few weeks.  Supposed to be on norvasc 10, but has been trying to stretch out his prescription for the past few weeks because he lost his insurance.  Borrowed a friend's BP meds this week,  took norvasc 10 mg 2 x this week and losartan/HCTZ 100/25 a few times since Friday.  BP in the 120-140 systolic range today despite not taking any meds since last night.     Today is diffusely weak but symmetric exam.  Slightly more weak lower extremities than upper.  Fatigues easily on strength testing.  Significant difficulty changing positions.  Took 2 people to help him stand up and he was only able to take a few steps prior to looking visibly fatigued.  Appears to be giving good effort.  Has a muscular body and reports that previously he had no deficits.    Denies recent travel or outdoor activities that could be concerning for tick bite.  No recent rashes.  No family hx of neuro diseases.  Doubt infectious cause based on lack of viral like sx, afebrile.  Denies ingestants or medication use other than the above BP  meds.  No recent trauma.  Denies kidney pathology hx, has no abdominal sx, still with normal bathroom habits.  No hx of cardiopulmonary disease, he denies chest pain, SOB, and palpitations.  Unclear source of his generalized weakness.  Could be PRESS syndrome due to HTN but is now normotensive and sx continue.  Will test with broad labs including kidney function, thyroid studies, CXR, EKG, and CT head.   Work up returned benign.  CT head negative for acute changes.   Spoke to neurology to discuss his case and possible additional imaging.  Reports that MRI brain could be helpful to rule out PRESS syndrome.  If negative, unclear what his generalized weakness could be 2/2.    MRI returned negative.  Spoke to Dr. Julian Reil at 2030 to discuss the patient.  He also voiced that he was somewhat lost as to the cause of patient's sx.  Not kidney, weakness did not occur in ascending/descending pattern, no recent viral illnesses, no known sick contacts, doesn't appear to be 2/2 brain pathology based on imaging.  Neurology, Dr. Cyril Mourning, called as well.  Will admit to hospitalist team for further work up.  Appreciate their help. Patient and family aware and agreeable to admission.  Floor appropriate, vitals stable throughout ED stay.   Patient was seen with ED Attending, Dr. Lawernce Keas, MD  Lenell Antu, MD 11/02/14 1448  Gwyneth Sprout, MD 11/02/14 1610

## 2014-11-01 NOTE — ED Notes (Signed)
Assisted patient with ambulation. Patient unable to ambulate without 2 person assist. Patient reports being unsteady and c/o heaviness in legs and sob. RN and MD made aware, both in room upon return.

## 2014-11-02 ENCOUNTER — Observation Stay (HOSPITAL_COMMUNITY): Payer: Medicaid Other

## 2014-11-02 DIAGNOSIS — I10 Essential (primary) hypertension: Secondary | ICD-10-CM

## 2014-11-02 LAB — MAGNESIUM: Magnesium: 2 mg/dL (ref 1.7–2.4)

## 2014-11-02 LAB — CK: CK TOTAL: 458 U/L — AB (ref 49–397)

## 2014-11-02 LAB — VITAMIN B12: VITAMIN B 12: 356 pg/mL (ref 180–914)

## 2014-11-02 MED ORDER — HYDROCHLOROTHIAZIDE 25 MG PO TABS
25.0000 mg | ORAL_TABLET | Freq: Every day | ORAL | Status: DC
  Administered 2014-11-02 – 2014-11-03 (×2): 25 mg via ORAL
  Filled 2014-11-02 (×2): qty 1

## 2014-11-02 MED ORDER — LISINOPRIL 20 MG PO TABS
20.0000 mg | ORAL_TABLET | Freq: Every day | ORAL | Status: DC
Start: 2014-11-02 — End: 2014-11-03
  Administered 2014-11-02 – 2014-11-03 (×2): 20 mg via ORAL
  Filled 2014-11-02 (×2): qty 1

## 2014-11-02 NOTE — Evaluation (Signed)
Physical Therapy Evaluation Patient Details Name: Ian Thomas MRN: [id number] DOB: October 06, 1980 Today's Date: 11/02/2014   History of Present Illness  Ian Thomas is an 34 y.o. male with a past medical history significant for HTN, comes in for further evaluation of the above stated symptoms. He is accompanied by family members.  Clinical Impression  Pt functional presentation inconsistent with MMT and reported symptoms. Pt c/o fatigue during ambulation and LE weakness however patient able to manage LEs in/out of bed and demo'd slow controlled movement t/o MMT and functional mobility. PT to assess stairs tomorrow.    Follow Up Recommendations Home health PT;Supervision/Assistance - 24 hour (may be able to progress to outpatient)    Equipment Recommendations  Rolling walker with 5" wheels (if improve tomorrow may not need RW)    Recommendations for Other Services       Precautions / Restrictions Precautions Precautions: Fall Restrictions Weight Bearing Restrictions: No      Mobility  Bed Mobility Overal bed mobility: Modified Independent             General bed mobility comments: HOB elevated, pt able to transfer self in/out of bed with increased time. braced self with UEs and used abdominal muscles while isometrically contracting quadriceps to bring LEs back up into bed  Transfers Overall transfer level: Needs assistance Equipment used: Rolling walker (2 wheeled) Transfers: Sit to/from Stand Sit to Stand: Min guard         General transfer comment: v/c's for technique and foot placement  Ambulation/Gait Ambulation/Gait assistance: Min guard Ambulation Distance (Feet): 40 Feet Assistive device: Rolling walker (2 wheeled) Gait Pattern/deviations: Step-through pattern;Decreased stride length Gait velocity: slow   General Gait Details: pt very slow with dependence and suspension of UEs to advance LEs in a step through gait pattern with decreased step length. pt c/o  "I'm so wore out"  Stairs            Wheelchair Mobility    Modified Rankin (Stroke Patients Only)       Balance Overall balance assessment: Needs assistance Sitting-balance support: Feet supported;No upper extremity supported Sitting balance-Leahy Scale: Fair     Standing balance support: Bilateral upper extremity supported Standing balance-Leahy Scale: Poor Standing balance comment: needs RW                             Pertinent Vitals/Pain Pain Assessment: No/denies pain    Home Living Family/patient expects to be discharged to:: Private residence Living Arrangements: Spouse/significant other Available Help at Discharge: Family;Available 24 hours/day Type of Home: House Home Access: Level entry     Home Layout: Two level Home Equipment: None      Prior Function Level of Independence: Independent         Comments: sells furniture     Hand Dominance   Dominant Hand: Left    Extremity/Trunk Assessment   Upper Extremity Assessment: RUE deficits/detail;LUE deficits/detail RUE Deficits / Details: pt able to eccentrically control and isometrically support self to lift LEs in the bed which was inconsistent with MMT of grossly 4-/5     LUE Deficits / Details: pt able to eccentrically control and isometrically support self to lift LEs in the bed which was inconsistent with MMT of grossly 4-/5   Lower Extremity Assessment: RLE deficits/detail;LLE deficits/detail RLE Deficits / Details: pt able to lift LEs up onto bed with increased time but tested at 4-/5, pt also noted with controlled mvmt  especially eccentrically from extension into flexion at the knee. pt also able to maintain LE into fixed position during advancement of LEs during amb LLE Deficits / Details: pt able to lift LEs up onto bed with increased time but tested at 4-/5, pt also noted with controlled mvmt especially eccentrically from extension into flexion at the knee. pt also able to  maintain LE into fixed position during advancement of LEs during amb  Cervical / Trunk Assessment: Normal  Communication   Communication: No difficulties  Cognition Arousal/Alertness: Awake/alert Behavior During Therapy: WFL for tasks assessed/performed Overall Cognitive Status: Within Functional Limits for tasks assessed                      General Comments      Exercises        Assessment/Plan    PT Assessment Patient needs continued PT services  PT Diagnosis Difficulty walking   PT Problem List Decreased strength;Decreased activity tolerance;Decreased balance;Decreased mobility  PT Treatment Interventions DME instruction;Gait training;Stair training;Functional mobility training;Therapeutic activities;Therapeutic exercise;Balance training   PT Goals (Current goals can be found in the Care Plan section) Acute Rehab PT Goals Patient Stated Goal: home PT Goal Formulation: With patient Time For Goal Achievement: 11/09/14 Potential to Achieve Goals: Good    Frequency Min 4X/week   Barriers to discharge   20 steps    Co-evaluation               End of Session Equipment Utilized During Treatment: Gait belt Activity Tolerance: Patient tolerated treatment well Patient left: in bed;with call bell/phone within reach Nurse Communication: Mobility status    Functional Assessment Tool Used: clinical judgment Functional Limitation: Mobility: Walking and moving around Mobility: Walking and Moving Around Current Status (Z6109): At least 1 percent but less than 20 percent impaired, limited or restricted Mobility: Walking and Moving Around Goal Status [phone]): At least 1 percent but less than 20 percent impaired, limited or restricted    Time: 1615-1630 PT Time Calculation (min) (ACUTE ONLY): 15 min   Charges:   PT Evaluation $Initial PT Evaluation Tier I: 1 Procedure     PT G Codes:   PT G-Codes **NOT FOR INPATIENT CLASS** Functional Assessment Tool Used:  clinical judgment Functional Limitation: Mobility: Walking and moving around Mobility: Walking and Moving Around Current Status (U9811): At least 1 percent but less than 20 percent impaired, limited or restricted Mobility: Walking and Moving Around Goal Status [phone]): At least 1 percent but less than 20 percent impaired, limited or restricted    Marcene Brawn 11/02/2014, 5:06 PM  Lewis Shock, PT, DPT Pager #: [phone] Office #: [phone]

## 2014-11-02 NOTE — Progress Notes (Signed)
[facility] Progress Note   [name] QMV:[id number] DOB: 04-09-1981 DOA: 11/01/2014 PCP: No PCP Per Patient  Brief narrative: Ian Thomas is a 34 y.o. male with past medical history of hypertension who presents to the hospital with a complaint of weakness and headache since Thursday of last week. The patient states that on Thursday he noted his blood pressure became extremely elevated with a top number being greater than 200. He took some of his friend's blood pressure medications. He was previously on blood pressure medications himself but due to lack of insurance he has not been on medications for 2 years. He states that blood pressure improved however he noticed weakness initially in his left face along with bilateral lower extremities. He got to the point where he found it difficult to get up and ambulate and therefore presented to the ER. In the ER he was noted to continue to have significant difficulty with a complaint of heaviness of his legs and weakness when attempting to ambulate.   Subjective: Feels that his legs are much better today and he can ambulate with a walker  Assessment/Plan: Active Problems:   Weakness of both legs -No clear etiology found- -He has had a CT of the head without contrast, MRI of the brain without contrast, MRI of the C-spine and thoracic spine without contrast all of which were unrevealing-see reports below - has been evaluated by Neuro as well -Weakness improving without any interventions - possible conversion disorder?  HTN - has not been on medications as he has no insurance - given Losartan/ HCTZ last night - start Lisinopril/ HCTZ which he will be able to afford ([facility] drug list) - follow BP - case management consult to help obtain appt [facility]   Code Status: Full  Family Communication:  Disposition Plan: home in AM- PT eval DVT prophylaxis: Heparin Consultants:Neuro  Antibiotics: Anti-infectives     None      Objective: Filed Weights   11/01/14 1131 11/01/14 2343  Weight: 99.338 kg (219 lb) 102.422 kg (225 lb 12.8 oz)    Intake/Output Summary (Last 24 hours) at 11/02/14 1408 Last data filed at 11/02/14 1358  Gross per 24 hour  Intake    760 ml  Output    600 ml  Net    160 ml     Vitals Filed Vitals:   11/01/14 2144 11/01/14 2343 11/02/14 0104 11/02/14 0456  BP: 118/77 162/87 137/94 121/66  Pulse: 89 93 93 88  Temp:  99.2 F (37.3 C)  97.4 F (36.3 C)  TempSrc:  Oral  Oral  Resp: 16 16 18 16   Height:  5' 10.8" (1.798 m)    Weight:  102.422 kg (225 lb 12.8 oz)    SpO2: 97% 95% 97% 94%    Exam:  General:  Pt is alert, not in acute distress  HEENT: No icterus, No thrush  Cardiovascular: regular rate and rhythm, S1/S2 No murmur  Respiratory: clear to auscultation bilaterally   Abdomen: Soft, +Bowel sounds, non tender, non distended, no guarding  MSK: No LE edema, cyanosis or clubbing  Neuro: 4/5 strength in legs, 5/5 in arms, CN 2-12 intact  Data Reviewed: Basic Metabolic Panel:  Recent Labs Lab 11/01/14 1140 11/02/14 0042  NA 137  --   K 4.4  --   CL 100*  --   CO2 28  --   GLUCOSE 115*  --   BUN 13  --   CREATININE 1.17  --  CALCIUM 10.2  --   MG  --  2.0   Liver Function Tests:  Recent Labs Lab 11/01/14 1140  AST 39  ALT 79*  ALKPHOS 70  BILITOT 0.8  PROT 8.8*  ALBUMIN 4.5   No results for input(s): LIPASE, AMYLASE in the last 168 hours. No results for input(s): AMMONIA in the last 168 hours. CBC:  Recent Labs Lab 11/01/14 1140  WBC 3.0*  NEUTROABS 1.5*  HGB 15.2  HCT 45.0  MCV 90.4  PLT 227   Cardiac Enzymes:  Recent Labs Lab 11/02/14 0042  CKTOTAL 458*   BNP (last 3 results) No results for input(s): BNP in the last 8760 hours.  ProBNP (last 3 results) No results for input(s): PROBNP in the last 8760 hours.  CBG:  Recent Labs Lab 11/01/14 1142  GLUCAP 106*    No results found for this or any  previous visit (from the past 240 hour(s)).   Studies: Dg Chest 2 View  11/01/2014   CLINICAL DATA:  Chest pain, shortness of breath, headache, weakness and dizziness for 4 days, history hypertension  EXAM: CHEST  2 VIEW  COMPARISON:  None  FINDINGS: Normal heart size, mediastinal contours, and pulmonary vascularity.  Lungs clear.  No pleural effusion or pneumothorax.  Minimal levoconvex scoliosis of the upper thoracic spine.  IMPRESSION: No acute abnormalities.   Electronically Signed   By: Ulyses Southward M.D.   On: 11/01/2014 16:06   Ct Head Wo Contrast  11/01/2014   CLINICAL DATA:  Generalized weakness, LEFT side extremity weakness and slurring words since Thursday, history hypertension  EXAM: CT HEAD WITHOUT CONTRAST  TECHNIQUE: Contiguous axial images were obtained from the base of the skull through the vertex without intravenous contrast.  COMPARISON:  None  FINDINGS: Normal ventricular morphology.  No midline shift or mass effect.  Normal appearance of brain parenchyma.  No intracranial hemorrhage, mass lesion, or acute infarction.  Visualized paranasal sinuses and mastoid air cells clear.  Bones unremarkable.  IMPRESSION: Normal exam.   Electronically Signed   By: Ulyses Southward M.D.   On: 11/01/2014 17:16   [name] Contrast  11/01/2014   CLINICAL DATA:  Weakness and headache. Slurred speech and left-sided droop.  EXAM: MRI HEAD WITHOUT CONTRAST  TECHNIQUE: Multiplanar, multiecho pulse sequences of the brain and surrounding structures were obtained without intravenous contrast.  COMPARISON:  Head CT earlier today  FINDINGS: There is no acute infarct. Ventricles and sulci are normal for age. There is no evidence of intracranial hemorrhage, mass, midline shift, or extra-axial fluid collection. No brain parenchymal signal abnormality is identified.  Orbits are unremarkable. Minimal left maxillary sinus mucosal thickening is noted. No significant mastoid effusion. Major intracranial vascular flow voids are  preserved. Calvarium and scalp soft tissues are unremarkable.  IMPRESSION: Unremarkable appearance of the brain.   Electronically Signed   By: Sebastian Ache   On: 11/01/2014 20:07   Mr Cervical Spine Wo Contrast  11/02/2014   CLINICAL DATA:  Weakness and headache since Thursday with paraparesis of both lower limbs.  EXAM: MRI CERVICAL SPINE WITHOUT CONTRAST  TECHNIQUE: Multiplanar, multisequence MR imaging of the cervical spine was performed. No intravenous contrast was administered.  COMPARISON:  None.  FINDINGS: No marrow signal abnormality suggestive of fracture, infection, or neoplasm. 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 Normal cord signal and morphology. No notable extra-spinal findings.  Degenerative changes:  C2-3: Unremarkable.  C3-4: Minimal uncovertebral spurring  without stenosis.  C4-5: Mild degenerative disc change with anterior annular fissure, likely accelerated by subjacent non segmentation. There is mild right uncovertebral spurring without significant foraminal stenosis. Mild posterior disc bulging.  C5-6: Non segmentation with widely patent canal and foramina  C6-7: Small uncovertebral spurs without stenosis.  C7-T1:Unremarkable.  IMPRESSION: 1. Normal cervical cord. 2. Early uncovertebral spurring above and below C5-6 non segmentation. No significant canal or foraminal stenosis.   Electronically Signed   By: Marnee Spring M.D.   On: 11/02/2014 10:38   Mr Thoracic Spine Wo Contrast  11/02/2014   CLINICAL DATA:  Initial evaluation for acute weakness and headache.  EXAM: MRI THORACIC AND LUMBAR SPINE WITHOUT CONTRAST  TECHNIQUE: Multiplanar and multiecho pulse sequences of the thoracic and lumbar spine were obtained without intravenous contrast.  COMPARISON:  None available.  FINDINGS: MR THORACIC SPINE FINDINGS  Vertebral bodies are normally aligned with preservation of the normal thoracic kyphosis. Vertebral body heights are well  preserved. No fracture or listhesis. No focal osseous lesion. No bone marrow edema.  Signal intensity within these thoracic spinal cord is normal.  Paraspinous soft tissues are within normal limits. Partially visualized lungs are clear.  No significant degenerative disc disease identified within the thoracic spine. No canal or foraminal stenosis.  MR LUMBAR SPINE FINDINGS  Vertebral bodies are normally aligned with preservation of the normal lumbar lordosis. Vertebral body heights are well preserved. Signal intensity within the vertebral body bone marrow is normal. No marrow edema. No focal osseous lesion. No fracture or listhesis.  Conus medullaris terminates normally at the L1 level. Signal intensity within the visualized cord is normal. Nerve roots of the cauda equina are within normal limits.  Paraspinous soft tissues are unremarkable.  No significant degenerative disc disease identified within the lumbar spine. Intervertebral discs are well hydrated. No focal disc herniation. Mild bilateral facet arthrosis present at L5-S1. No significant stenosis identified.  IMPRESSION: MR THORACIC SPINE IMPRESSION  Normal MRI of the thoracic spine.  MR LUMBAR SPINE IMPRESSION  1. No acute abnormality within the lumbar spine. 2. No significant stenosis identified within the lumbar spine. 3. Mild bilateral facet arthrosis at L5-S1. No others significant degenerative changes identified. No significant canal or foraminal stenosis.   Electronically Signed   By: Rise Mu M.D.   On: 11/02/2014 00:01   Mr Lumbar Spine Wo Contrast  11/02/2014   CLINICAL DATA:  Initial evaluation for acute weakness and headache.  EXAM: MRI THORACIC AND LUMBAR SPINE WITHOUT CONTRAST  TECHNIQUE: Multiplanar and multiecho pulse sequences of the thoracic and lumbar spine were obtained without intravenous contrast.  COMPARISON:  None available.  FINDINGS: MR THORACIC SPINE FINDINGS  Vertebral bodies are normally aligned with preservation of  the normal thoracic kyphosis. Vertebral body heights are well preserved. No fracture or listhesis. No focal osseous lesion. No bone marrow edema.  Signal intensity within these thoracic spinal cord is normal.  Paraspinous soft tissues are within normal limits. Partially visualized lungs are clear.  No significant degenerative disc disease identified within the thoracic spine. No canal or foraminal stenosis.  MR LUMBAR SPINE FINDINGS  Vertebral bodies are normally aligned with preservation of the normal lumbar lordosis. Vertebral body heights are well preserved. Signal intensity within the vertebral body bone marrow is normal. No marrow edema. No focal osseous lesion. No fracture or listhesis.  Conus medullaris terminates normally at the L1 level. Signal intensity within the visualized cord is normal. Nerve roots of the cauda equina are within normal  limits.  Paraspinous soft tissues are unremarkable.  No significant degenerative disc disease identified within the lumbar spine. Intervertebral discs are well hydrated. No focal disc herniation. Mild bilateral facet arthrosis present at L5-S1. No significant stenosis identified.  IMPRESSION: MR THORACIC SPINE IMPRESSION  Normal MRI of the thoracic spine.  MR LUMBAR SPINE IMPRESSION  1. No acute abnormality within the lumbar spine. 2. No significant stenosis identified within the lumbar spine. 3. Mild bilateral facet arthrosis at L5-S1. No others significant degenerative changes identified. No significant canal or foraminal stenosis.   Electronically Signed   By: Rise Mu M.D.   On: 11/02/2014 00:01    Scheduled Meds:  Scheduled Meds: . heparin  5,000 Units Subcutaneous 3 times per day  . hydrochlorothiazide  25 mg Oral Daily  . lisinopril  20 mg Oral Daily   Continuous Infusions:   Time spent on care of this patient: 35 min   Veronnica Hennings, MD 11/02/2014, 2:08 PM    [facility] Office  [phone] Pager - Text Page per  [email or web address]  If 7PM-7AM, please contact night-coverage [email or web address]

## 2014-11-02 NOTE — Consult Note (Addendum)
NEURO HOSPITALIST CONSULT NOTE   Referring physician: Dr. Maryan Rued Reason for Consult: LE weakness, inability to walk  HPI:                                                                                                                                          Ian Thomas is an 34 y.o. male with a past medical history significant for HTN, comes in for further evaluation of the above stated symptoms. He is accompanied by family members. Ian Thomas stated that he never had similar symptoms before, but this past Thursday he developed acute onset of left sided leg-arm-face weakness that coincided with HA and elevated BP. Then, stated that his weakness became more global but definitively more localized to his legs, to the point that he is now unable to walk without assistance. Denies pain or numbness-tingling lower limbs. No bladder or bowel impairment. No falls. Further, he denies vertigo, double vision, difficulty swallowing, slurred speech, language or vision impairment. No neck or back pain. No recent fever, infection, skin rash, diarrhea, vaccination, or foreign travel. CT brain as well as MRI brain were personally reviewed and did not showed acute abnormality or other lesion to explain his symptoms.  Past Medical History  Diagnosis Date  . Hypertension     History reviewed. No pertinent past surgical history.  Family History  Problem Relation Age of Onset  . Hypertension Mother   . Hypertension Father   . Diabetes Sister   . Heart attack Neg Hx   . Hyperlipidemia Neg Hx   . Sudden death Neg Hx     Family History: no MS, myopathy, brain tumors, epilepsy, or brain aneurysms.   Social History:  reports that he has never smoked. He does not have any smokeless tobacco history on file. He reports that he does not drink alcohol or use illicit drugs.  No Known Allergies  MEDICATIONS:                                                                                                                      Scheduled: . heparin  5,000 Units Subcutaneous 3 times per day     ROS:  History obtained from the patient, family, and chart review  General ROS: negative for - chills, fatigue, fever, night sweats, weight gain or weight loss Psychological ROS: negative for - behavioral disorder, hallucinations, memory difficulties, mood swings or suicidal ideation Ophthalmic ROS: negative for - blurry vision, double vision, eye pain or loss of vision ENT ROS: negative for - epistaxis, nasal discharge, oral lesions, sore throat, tinnitus or vertigo Allergy and Immunology ROS: negative for - hives or itchy/watery eyes Hematological and Lymphatic ROS: negative for - bleeding problems, bruising or swollen lymph nodes Endocrine ROS: negative for - galactorrhea, hair pattern changes, polydipsia/polyuria or temperature intolerance Respiratory ROS: negative for - cough, hemoptysis, shortness of breath or wheezing Cardiovascular ROS: negative for - chest pain, dyspnea on exertion, edema or irregular heartbeat Gastrointestinal ROS: negative for - abdominal pain, diarrhea, hematemesis, nausea/vomiting or stool incontinence Genito-Urinary ROS: negative for - dysuria, hematuria, incontinence or urinary frequency/urgency Musculoskeletal ROS: negative for - joint swelling Neurological ROS: as noted in HPI Dermatological ROS: negative for rash and skin lesion changes   Physical exam: pleasant male in no apparent distress. Blood pressure 137/94, pulse 93, temperature 99.2 F (37.3 C), temperature source Oral, resp. rate 18, height 5' 10.8" (1.798 m), weight 102.422 kg (225 lb 12.8 oz), SpO2 97 %. Head: normocephalic. Neck: supple, no bruits, no JVD. Cardiac: no murmurs. Lungs: clear. Abdomen: soft, no tender, no mass. Extremities: no edema. Skin: no  rash  Neurologic Examination:                                                                                                      General: Mental Status: Alert, oriented, thought content appropriate.  Speech fluent without evidence of aphasia.  Able to follow 3 step commands without difficulty. Cranial Nerves: II: Discs flat bilaterally; Visual fields grossly normal, pupils equal, round, reactive to light and accommodation III,IV, VI: ptosis not present, extra-ocular motions intact bilaterally V,VII: smile symmetric, facial light touch sensation normal bilaterally VIII: hearing normal bilaterally IX,X: uvula rises symmetrically XI: bilateral shoulder shrug XII: midline tongue extension without atrophy or fasciculations Motor: Mild give away weakness upper extremities  2/5 hip flexors bilaterally, 3+/5 knee extensors and hamstrings, as well as 4-/5 dorsiflexors and foot evertors-invertors. Tone and bulk:normal tone throughout; no atrophy noted Sensory: Pinprick and light touch intact throughout, bilaterally Deep Tendon Reflexes:  Right: Upper Extremity   Left: Upper extremity   biceps (C-5 to C-6) 2/4   biceps (C-5 to C-6) 2/4 tricep (C7) 2/4    triceps (C7) 2/4 Brachioradialis (C6) 2/4  Brachioradialis (C6) 2/4  Lower Extremity Lower Extremity  quadriceps (L-2 to L-4) 2/4   quadriceps (L-2 to L-4) 2/4 Achilles (S1) 2/4   Achilles (S1) 2/4  Plantars: Right: downgoing   Left: downgoing Cerebellar: normal finger-to-nose,  normal heel-to-shin test Gait: he walks with some difficulty with assistance, no frank ataxia.    No results found for: CHOL  Results for orders placed or performed during the hospital encounter of 11/01/14 (from the past 48 hour(s))  CBC     Status: Abnormal  Collection Time: 11/01/14 11:40 AM  Result Value Ref Range   WBC 3.0 (L) 4.0 - 10.5 K/uL   RBC 4.98 4.22 - 5.81 MIL/uL   Hemoglobin 15.2 13.0 - 17.0 g/dL   HCT 45.0 39.0 - 52.0 %   MCV 90.4 78.0  - 100.0 fL   MCH 30.5 26.0 - 34.0 pg   MCHC 33.8 30.0 - 36.0 g/dL   RDW 13.7 11.5 - 15.5 %   Platelets 227 150 - 400 K/uL  Differential     Status: Abnormal   Collection Time: 11/01/14 11:40 AM  Result Value Ref Range   Neutrophils Relative % 50 43 - 77 %   Neutro Abs 1.5 (L) 1.7 - 7.7 K/uL   Lymphocytes Relative 37 12 - 46 %   Lymphs Abs 1.1 0.7 - 4.0 K/uL   Monocytes Relative 10 3 - 12 %   Monocytes Absolute 0.3 0.1 - 1.0 K/uL   Eosinophils Relative 3 0 - 5 %   Eosinophils Absolute 0.1 0.0 - 0.7 K/uL   Basophils Relative 0 0 - 1 %   Basophils Absolute 0.0 0.0 - 0.1 K/uL  Comprehensive metabolic panel     Status: Abnormal   Collection Time: 11/01/14 11:40 AM  Result Value Ref Range   Sodium 137 135 - 145 mmol/L   Potassium 4.4 3.5 - 5.1 mmol/L   Chloride 100 (L) 101 - 111 mmol/L   CO2 28 22 - 32 mmol/L   Glucose, Bld 115 (H) 70 - 99 mg/dL   BUN 13 6 - 20 mg/dL   Creatinine, Ser 1.17 0.61 - 1.24 mg/dL   Calcium 10.2 8.9 - 10.3 mg/dL   Total Protein 8.8 (H) 6.5 - 8.1 g/dL   Albumin 4.5 3.5 - 5.0 g/dL   AST 39 15 - 41 U/L   ALT 79 (H) 17 - 63 U/L   Alkaline Phosphatase 70 38 - 126 U/L   Total Bilirubin 0.8 0.3 - 1.2 mg/dL   GFR calc non Af Amer >60 >60 mL/min   GFR calc Af Amer >60 >60 mL/min    Comment: (NOTE) The eGFR has been calculated using the CKD EPI equation. This calculation has not been validated in all clinical situations. eGFR's persistently <90 mL/min signify possible Chronic Kidney Disease.    Anion gap 9 5 - 15  CBG monitoring, ED     Status: Abnormal   Collection Time: 11/01/14 11:42 AM  Result Value Ref Range   Glucose-Capillary 106 (H) 70 - 99 mg/dL  TSH     Status: None   Collection Time: 11/01/14  3:42 PM  Result Value Ref Range   TSH 3.025 0.350 - 4.500 uIU/mL  I-Stat Troponin, ED (not at [facility])     Status: None   Collection Time: 11/01/14  3:49 PM  Result Value Ref Range   Troponin i, poc 0.00 0.00 - 0.08 ng/mL   Comment 3            Comment:  Due to the release kinetics of cTnI, a negative result within the first hours of the onset of symptoms does not rule out myocardial infarction with certainty. If myocardial infarction is still suspected, repeat the test at appropriate intervals.   Urinalysis, Routine w reflex microscopic     Status: None   Collection Time: 11/01/14  4:25 PM  Result Value Ref Range   Color, Urine YELLOW YELLOW   APPearance CLEAR CLEAR   Specific Gravity, Urine 1.012 1.005 - 1.030  pH 6.5 5.0 - 8.0   Glucose, UA NEGATIVE NEGATIVE mg/dL   Hgb urine dipstick NEGATIVE NEGATIVE   Bilirubin Urine NEGATIVE NEGATIVE   Ketones, ur NEGATIVE NEGATIVE mg/dL   Protein, ur NEGATIVE NEGATIVE mg/dL   Urobilinogen, UA 0.2 0.0 - 1.0 mg/dL   Nitrite NEGATIVE NEGATIVE   Leukocytes, UA NEGATIVE NEGATIVE    Comment: MICROSCOPIC NOT DONE ON URINES WITH NEGATIVE PROTEIN, BLOOD, LEUKOCYTES, NITRITE, OR GLUCOSE <1000 mg/dL.    Dg Chest 2 View  11/01/2014   CLINICAL DATA:  Chest pain, shortness of breath, headache, weakness and dizziness for 4 days, history hypertension  EXAM: CHEST  2 VIEW  COMPARISON:  None  FINDINGS: Normal heart size, mediastinal contours, and pulmonary vascularity.  Lungs clear.  No pleural effusion or pneumothorax.  Minimal levoconvex scoliosis of the upper thoracic spine.  IMPRESSION: No acute abnormalities.   Electronically Signed   By: Lavonia Dana M.D.   On: 11/01/2014 16:06   Ct Head Wo Contrast  11/01/2014   CLINICAL DATA:  Generalized weakness, LEFT side extremity weakness and slurring words since Thursday, history hypertension  EXAM: CT HEAD WITHOUT CONTRAST  TECHNIQUE: Contiguous axial images were obtained from the base of the skull through the vertex without intravenous contrast.  COMPARISON:  None  FINDINGS: Normal ventricular morphology.  No midline shift or mass effect.  Normal appearance of brain parenchyma.  No intracranial hemorrhage, mass lesion, or acute infarction.  Visualized paranasal  sinuses and mastoid air cells clear.  Bones unremarkable.  IMPRESSION: Normal exam.   Electronically Signed   By: Lavonia Dana M.D.   On: 11/01/2014 17:16   [name] Contrast  11/01/2014   CLINICAL DATA:  Weakness and headache. Slurred speech and left-sided droop.  EXAM: MRI HEAD WITHOUT CONTRAST  TECHNIQUE: Multiplanar, multiecho pulse sequences of the brain and surrounding structures were obtained without intravenous contrast.  COMPARISON:  Head CT earlier today  FINDINGS: There is no acute infarct. Ventricles and sulci are normal for age. There is no evidence of intracranial hemorrhage, mass, midline shift, or extra-axial fluid collection. No brain parenchymal signal abnormality is identified.  Orbits are unremarkable. Minimal left maxillary sinus mucosal thickening is noted. No significant mastoid effusion. Major intracranial vascular flow voids are preserved. Calvarium and scalp soft tissues are unremarkable.  IMPRESSION: Unremarkable appearance of the brain.   Electronically Signed   By: Logan Bores   On: 11/01/2014 20:07   Mr Thoracic Spine Wo Contrast  11/02/2014   CLINICAL DATA:  Initial evaluation for acute weakness and headache.  EXAM: MRI THORACIC AND LUMBAR SPINE WITHOUT CONTRAST  TECHNIQUE: Multiplanar and multiecho pulse sequences of the thoracic and lumbar spine were obtained without intravenous contrast.  COMPARISON:  None available.  FINDINGS: MR THORACIC SPINE FINDINGS  Vertebral bodies are normally aligned with preservation of the normal thoracic kyphosis. Vertebral body heights are well preserved. No fracture or listhesis. No focal osseous lesion. No bone marrow edema.  Signal intensity within these thoracic spinal cord is normal.  Paraspinous soft tissues are within normal limits. Partially visualized lungs are clear.  No significant degenerative disc disease identified within the thoracic spine. No canal or foraminal stenosis.  MR LUMBAR SPINE FINDINGS  Vertebral bodies are normally  aligned with preservation of the normal lumbar lordosis. Vertebral body heights are well preserved. Signal intensity within the vertebral body bone marrow is normal. No marrow edema. No focal osseous lesion. No fracture or listhesis.  Conus medullaris terminates normally at the  L1 level. Signal intensity within the visualized cord is normal. Nerve roots of the cauda equina are within normal limits.  Paraspinous soft tissues are unremarkable.  No significant degenerative disc disease identified within the lumbar spine. Intervertebral discs are well hydrated. No focal disc herniation. Mild bilateral facet arthrosis present at L5-S1. No significant stenosis identified.  IMPRESSION: MR THORACIC SPINE IMPRESSION  Normal MRI of the thoracic spine.  MR LUMBAR SPINE IMPRESSION  1. No acute abnormality within the lumbar spine. 2. No significant stenosis identified within the lumbar spine. 3. Mild bilateral facet arthrosis at L5-S1. No others significant degenerative changes identified. No significant canal or foraminal stenosis.   Electronically Signed   By: Jeannine Boga M.D.   On: 11/02/2014 00:01   Mr Lumbar Spine Wo Contrast  11/02/2014   CLINICAL DATA:  Initial evaluation for acute weakness and headache.  EXAM: MRI THORACIC AND LUMBAR SPINE WITHOUT CONTRAST  TECHNIQUE: Multiplanar and multiecho pulse sequences of the thoracic and lumbar spine were obtained without intravenous contrast.  COMPARISON:  None available.  FINDINGS: MR THORACIC SPINE FINDINGS  Vertebral bodies are normally aligned with preservation of the normal thoracic kyphosis. Vertebral body heights are well preserved. No fracture or listhesis. No focal osseous lesion. No bone marrow edema.  Signal intensity within these thoracic spinal cord is normal.  Paraspinous soft tissues are within normal limits. Partially visualized lungs are clear.  No significant degenerative disc disease identified within the thoracic spine. No canal or foraminal  stenosis.  MR LUMBAR SPINE FINDINGS  Vertebral bodies are normally aligned with preservation of the normal lumbar lordosis. Vertebral body heights are well preserved. Signal intensity within the vertebral body bone marrow is normal. No marrow edema. No focal osseous lesion. No fracture or listhesis.  Conus medullaris terminates normally at the L1 level. Signal intensity within the visualized cord is normal. Nerve roots of the cauda equina are within normal limits.  Paraspinous soft tissues are unremarkable.  No significant degenerative disc disease identified within the lumbar spine. Intervertebral discs are well hydrated. No focal disc herniation. Mild bilateral facet arthrosis present at L5-S1. No significant stenosis identified.  IMPRESSION: MR THORACIC SPINE IMPRESSION  Normal MRI of the thoracic spine.  MR LUMBAR SPINE IMPRESSION  1. No acute abnormality within the lumbar spine. 2. No significant stenosis identified within the lumbar spine. 3. Mild bilateral facet arthrosis at L5-S1. No others significant degenerative changes identified. No significant canal or foraminal stenosis.   Electronically Signed   By: Jeannine Boga M.D.   On: 11/02/2014 00:01   Assessment/Plan: 34 y/o with a past medical history significant for HTN, comes in with progressive weakness with a pattern described above, which in the past 24 hours seems to be more confined to the lower extremities proximally>distally. Preserved DTR's, no sensory level, no bladder/bowel impairment. He is able to walk with assistance, his gait and stance is not particularly impaired despite such severe proximal weakness. Perplexing weakness, but certainly his neuro-exam is not suggestive of AIDP, an acute myopathy, or a myelopathy. MRI brain without acute abnormality. Get CK, MRI T-L spine. Did not request C spine MRI because mild give away weakness upper extremities and impression that his syndrome is not  consistent with a cervical myelopathy,  but certainly this will be reassessed.  PT consult. Will follow up.  Dorian Pod, MD 11/02/2014, 1:14 AM   Addendum: CK only slightly elevated and MRI T-L spine unrevealing. Will go ahead and obtain MRI C spine, although don't  feel strongly he has a cord syndrome.

## 2014-11-02 NOTE — Progress Notes (Signed)
Patient does not have PCP nor insurance.

## 2014-11-02 NOTE — Progress Notes (Signed)
UR completed 

## 2014-11-03 DIAGNOSIS — R531 Weakness: Secondary | ICD-10-CM | POA: Insufficient documentation

## 2014-11-03 DIAGNOSIS — I10 Essential (primary) hypertension: Secondary | ICD-10-CM

## 2014-11-03 LAB — BASIC METABOLIC PANEL
Anion gap: 12 (ref 5–15)
BUN: 13 mg/dL (ref 6–20)
CO2: 23 mmol/L (ref 22–32)
Calcium: 9.2 mg/dL (ref 8.9–10.3)
Chloride: 99 mmol/L — ABNORMAL LOW (ref 101–111)
Creatinine, Ser: 1.19 mg/dL (ref 0.61–1.24)
GFR calc non Af Amer: >60 mL/min (ref >60)
Glucose, Bld: 158 mg/dL — ABNORMAL HIGH (ref 70–99)
Potassium: 3.8 mmol/L (ref 3.5–5.1)
Sodium: 134 mmol/L — ABNORMAL LOW (ref 135–145)

## 2014-11-03 LAB — CK: Total CK: 365 U/L (ref 49–397)

## 2014-11-03 MED ORDER — LISINOPRIL-HYDROCHLOROTHIAZIDE 20-12.5 MG PO TABS: 1.0000 | ORAL_TABLET | Freq: Every day | ORAL | Status: AC

## 2014-11-03 NOTE — Discharge Summary (Addendum)
Physician Discharge Summary  Ian NasutiKevin Weinhold ZOX:[id number] DOB: 07/01/1981 DOA: 11/01/2014  PCP: No PCP Per Patient  Admit date: 11/01/2014 Discharge date: 11/03/2014  Time spent: 50 minutes  Recommendations for Outpatient Follow-up:  1. F/u BP   Discharge Condition: stable Diet recommendation: low sodium, heart healthy  Discharge Diagnoses:  Principal Problem:   HTN (hypertension) Active Problems:   Weakness of both legs   History of present illness:  Ian NasutiKevin Thomas is a 34 y.o. male with past medical history of hypertension who presents to the hospital with a complaint of weakness and headache since Thursday of last week. The patient states that on Thursday he noted his blood pressure became extremely elevated with a top number being greater than 200. He took some of his friend's blood pressure medications. He was previously on blood pressure medications himself but due to lack of insurance he has not been on medications for 2 years. He states that blood pressure improved however he noticed weakness initially in his left face along with bilateral lower extremities. He got to the point where he found it difficult to get up and ambulate and therefore presented to the ER. In the ER he was noted to continue to have significant difficulty with a complaint of heaviness of his legs and weakness when attempting to ambulate.  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- I have discussed this with Neuro and they are in agreement with this diagnosis - PT recommending outpatient PT  HTN - has not been on medications as he has no  insurance - given Losartan/ HCTZ by a family member at home for HTN- he has taken it in the past and states it was working well for him - started Lisinopril/ HCTZ which he will be able to afford ([facility] drug list) - BP today now controlled at 120/78 and 123/67 - case management consulted to help obtain appt with [facility]- he has an appt for 5/9   Consultations:  Neurology  [facility]   11/01/14 1131 11/01/14 2343  Weight: 99.338 kg (219 lb) 102.422 kg (225 lb 12.8 oz)   Filed Vitals:   11/03/14 0837  BP: 120/78  Pulse: 82  Temp: 97.8 F (36.6 C)  Resp: 18    General: AAO x 3, no distress Cardiovascular: RRR, no murmurs  Respiratory: clear to auscultation bilaterally GI: soft, non-tender, non-distended, bowel sound positive Neuro: CN 2- 12 intact, 5/5 strength in all 4 extermities  Discharge Instructions You were cared for by a hospitalist during your hospital stay. If you have any questions about your discharge medications or the care you received while you were in the hospital after you are discharged, you can call the unit and asked to speak with the hospitalist on call if the hospitalist that took care of you is not available. Once you are discharged, your primary care physician will handle any further medical issues. Please note that NO REFILLS for any discharge medications will be authorized once you are discharged, as it is imperative that you return to your primary care physician (or establish a relationship with a primary care physician if you  do not have one) for your aftercare needs so that they can reassess your need for medications and monitor your lab values.      Discharge Instructions    Diet - low sodium heart healthy    Complete by:  As directed      Increase activity slowly    Complete by:  As directed             Medication List    STOP taking these medications        ibuprofen 800 MG tablet  Commonly  known as:  ADVIL,MOTRIN      TAKE these medications        lisinopril-hydrochlorothiazide 20-12.5 MG per tablet  Commonly known as:  ZESTORETIC  Take 1 tablet by mouth daily.       No Known Allergies Follow-up Information    Follow up with [facility]     On 11/08/2014.   Why:  10:30 am for hospital follow up   Contact information:   [address] [place] [phone]       The results of significant diagnostics from this hospitalization (including imaging, microbiology, ancillary and laboratory) are listed below for reference.    Significant Diagnostic Studies: Dg Chest 2 View  11/01/2014   CLINICAL DATA:  Chest pain, shortness of breath, headache, weakness and dizziness for 4 days, history hypertension  EXAM: CHEST  2 VIEW  COMPARISON:  None  FINDINGS: Normal heart size, mediastinal contours, and pulmonary vascularity.  Lungs clear.  No pleural effusion or pneumothorax.  Minimal levoconvex scoliosis of the upper thoracic spine.  IMPRESSION: No acute abnormalities.   Electronically Signed   By: Ulyses SouthwardMark  Boles M.D.   On: 11/01/2014 16:06   Ct Head Wo Contrast  11/01/2014   CLINICAL DATA:  Generalized weakness, LEFT side extremity weakness and slurring words since Thursday, history hypertension  EXAM: CT HEAD WITHOUT CONTRAST  TECHNIQUE: Contiguous axial images were obtained from the base of the skull through the vertex without intravenous contrast.  COMPARISON:  None  FINDINGS: Normal ventricular morphology.  No midline shift or mass effect.  Normal appearance of brain parenchyma.  No intracranial hemorrhage, mass lesion, or acute infarction.  Visualized paranasal sinuses and mastoid air cells clear.  Bones unremarkable.  IMPRESSION: Normal exam.   Electronically Signed   By: Ulyses SouthwardMark  Boles M.D.   On: 11/01/2014 17:16   [name] Contrast  11/01/2014   CLINICAL DATA:  Weakness and headache. Slurred speech and left-sided droop.   EXAM: MRI HEAD WITHOUT CONTRAST  TECHNIQUE: Multiplanar, multiecho pulse sequences of the brain and surrounding structures were obtained without intravenous contrast.  COMPARISON:  Head CT earlier today  FINDINGS: There is no acute infarct. Ventricles and sulci are normal for age. There is no evidence of intracranial hemorrhage, mass, midline shift, or extra-axial fluid collection. No brain parenchymal signal abnormality is identified.  Orbits are unremarkable. Minimal left maxillary sinus mucosal thickening is noted. No significant mastoid effusion. Major intracranial vascular flow voids are preserved. Calvarium and scalp soft tissues are unremarkable.  IMPRESSION: Unremarkable appearance of the brain.   Electronically Signed   By: Sebastian AcheAllen  Grady   On: 11/01/2014 20:07   Mr Cervical Spine Wo Contrast  11/02/2014   CLINICAL DATA:  Weakness and headache since Thursday with paraparesis of both lower limbs.  EXAM: MRI CERVICAL SPINE WITHOUT CONTRAST  TECHNIQUE: Multiplanar, multisequence MR imaging of the cervical spine was performed.  No intravenous contrast was administered.  COMPARISON:  None.  FINDINGS: No marrow signal abnormality suggestive of fracture, infection, or neoplasm. 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 Normal cord signal and morphology. No notable extra-spinal findings.  Degenerative changes:  C2-3: Unremarkable.  C3-4: Minimal uncovertebral spurring without stenosis.  C4-5: Mild degenerative disc change with anterior annular fissure, likely accelerated by subjacent non segmentation. There is mild right uncovertebral spurring without significant foraminal stenosis. Mild posterior disc bulging.  C5-6: Non segmentation with widely patent canal and foramina  C6-7: Small uncovertebral spurs without stenosis.  C7-T1:Unremarkable.  IMPRESSION: 1. Normal cervical cord. 2. Early uncovertebral spurring above and below C5-6 non segmentation. No  significant canal or foraminal stenosis.   Electronically Signed   By: Marnee Spring M.D.   On: 11/02/2014 10:38   Mr Thoracic Spine Wo Contrast  11/02/2014   CLINICAL DATA:  Initial evaluation for acute weakness and headache.  EXAM: MRI THORACIC AND LUMBAR SPINE WITHOUT CONTRAST  TECHNIQUE: Multiplanar and multiecho pulse sequences of the thoracic and lumbar spine were obtained without intravenous contrast.  COMPARISON:  None available.  FINDINGS: MR THORACIC SPINE FINDINGS  Vertebral bodies are normally aligned with preservation of the normal thoracic kyphosis. Vertebral body heights are well preserved. No fracture or listhesis. No focal osseous lesion. No bone marrow edema.  Signal intensity within these thoracic spinal cord is normal.  Paraspinous soft tissues are within normal limits. Partially visualized lungs are clear.  No significant degenerative disc disease identified within the thoracic spine. No canal or foraminal stenosis.  MR LUMBAR SPINE FINDINGS  Vertebral bodies are normally aligned with preservation of the normal lumbar lordosis. Vertebral body heights are well preserved. Signal intensity within the vertebral body bone marrow is normal. No marrow edema. No focal osseous lesion. No fracture or listhesis.  Conus medullaris terminates normally at the L1 level. Signal intensity within the visualized cord is normal. Nerve roots of the cauda equina are within normal limits.  Paraspinous soft tissues are unremarkable.  No significant degenerative disc disease identified within the lumbar spine. Intervertebral discs are well hydrated. No focal disc herniation. Mild bilateral facet arthrosis present at L5-S1. No significant stenosis identified.  IMPRESSION: MR THORACIC SPINE IMPRESSION  Normal MRI of the thoracic spine.  MR LUMBAR SPINE IMPRESSION  1. No acute abnormality within the lumbar spine. 2. No significant stenosis identified within the lumbar spine. 3. Mild bilateral facet arthrosis at L5-S1.  No others significant degenerative changes identified. No significant canal or foraminal stenosis.   Electronically Signed   By: Rise Mu M.D.   On: 11/02/2014 00:01   Mr Lumbar Spine Wo Contrast  11/02/2014   CLINICAL DATA:  Initial evaluation for acute weakness and headache.  EXAM: MRI THORACIC AND LUMBAR SPINE WITHOUT CONTRAST  TECHNIQUE: Multiplanar and multiecho pulse sequences of the thoracic and lumbar spine were obtained without intravenous contrast.  COMPARISON:  None available.  FINDINGS: MR THORACIC SPINE FINDINGS  Vertebral bodies are normally aligned with preservation of the normal thoracic kyphosis. Vertebral body heights are well preserved. No fracture or listhesis. No focal osseous lesion. No bone marrow edema.  Signal intensity within these thoracic spinal cord is normal.  Paraspinous soft tissues are within normal limits. Partially visualized lungs are clear.  No significant degenerative disc disease identified within the thoracic spine. No canal or foraminal stenosis.  MR LUMBAR SPINE FINDINGS  Vertebral bodies are normally aligned with preservation of the normal  lumbar lordosis. Vertebral body heights are well preserved. Signal intensity within the vertebral body bone marrow is normal. No marrow edema. No focal osseous lesion. No fracture or listhesis.  Conus medullaris terminates normally at the L1 level. Signal intensity within the visualized cord is normal. Nerve roots of the cauda equina are within normal limits.  Paraspinous soft tissues are unremarkable.  No significant degenerative disc disease identified within the lumbar spine. Intervertebral discs are well hydrated. No focal disc herniation. Mild bilateral facet arthrosis present at L5-S1. No significant stenosis identified.  IMPRESSION: MR THORACIC SPINE IMPRESSION  Normal MRI of the thoracic spine.  MR LUMBAR SPINE IMPRESSION  1. No acute abnormality within the lumbar spine. 2. No significant stenosis identified within  the lumbar spine. 3. Mild bilateral facet arthrosis at L5-S1. No others significant degenerative changes identified. No significant canal or foraminal stenosis.   Electronically Signed   By: Rise Mu M.D.   On: 11/02/2014 00:01    Microbiology: No results found for this or any previous visit (from the past 240 hour(s)).   Labs: Basic Metabolic Panel:  Recent Labs Lab 11/01/14 1140 11/02/14 0042 11/03/14 0548  NA 137  --  134*  K 4.4  --  3.8  CL 100*  --  99*  CO2 28  --  23  GLUCOSE 115*  --  158*  BUN 13  --  13  CREATININE 1.17  --  1.19  CALCIUM 10.2  --  9.2  MG  --  2.0  --    Liver Function Tests:  Recent Labs Lab 11/01/14 1140  AST 39  ALT 79*  ALKPHOS 70  BILITOT 0.8  PROT 8.8*  ALBUMIN 4.5   No results for input(s): LIPASE, AMYLASE in the last 168 hours. No results for input(s): AMMONIA in the last 168 hours. CBC:  Recent Labs Lab 11/01/14 1140  WBC 3.0*  NEUTROABS 1.5*  HGB 15.2  HCT 45.0  MCV 90.4  PLT 227   Cardiac Enzymes:  Recent Labs Lab 11/02/14 0042 11/03/14 0548  CKTOTAL 458* 365   BNP: BNP (last 3 results) No results for input(s): BNP in the last 8760 hours.  ProBNP (last 3 results) No results for input(s): PROBNP in the last 8760 hours.  CBG:  Recent Labs Lab 11/01/14 1142  GLUCAP 106*       SignedCalvert Cantor, MD [facility] 11/03/2014, 11:08 AM

## 2014-11-03 NOTE — Progress Notes (Signed)
Subjective: Feels stronger, has been walking in the room with and without the walker.   Objective: Current vital signs: BP 120/78 mmHg  Pulse 82  Temp(Src) 97.8 F (36.6 C) (Oral)  Resp 18  Ht 5' 10.8" (1.798 m)  Wt 102.422 kg (225 lb 12.8 oz)  BMI 31.68 kg/m2  SpO2 100% Vital signs in last 24 hours: Temp:  [97.8 F (36.6 C)-98.4 F (36.9 C)] 97.8 F (36.6 C) (05/04 0837) Pulse Rate:  [82-108] 82 (05/04 0837) Resp:  [16-20] 18 (05/04 0837) BP: (120-150)/(67-93) 120/78 mmHg (05/04 0837) SpO2:  [97 %-100 %] 100 % (05/04 0837)  Intake/Output from previous day: 05/03 0701 - 05/04 0700 In: 400 [P.O.:400] Out: 600 [Urine:600] Intake/Output this shift:   Nutritional status: Diet regular Room service appropriate?: Yes; Fluid consistency:: Thin  Neurologic Exam: General: Mental Status: Alert, oriented, thought content appropriate.  Speech fluent without evidence of aphasia.  Able to follow 3 step commands without difficulty. Cranial Nerves: II:  Visual fields grossly normal, pupils equal, round, reactive to light and accommodation III,IV, VI: ptosis not present, extra-ocular motions intact bilaterally V,VII: smile symmetric, facial light touch sensation normal bilaterally VIII: hearing normal bilaterally IX,X: uvula rises symmetrically XI: bilateral shoulder shrug XII: midline tongue extension without atrophy or fasciculations  Motor: Right : Upper extremity   5/5    Left:     Upper extremity   5/5  Lower extremity   5/5     Lower extremity   5/5 Tone and bulk:normal tone throughout; no atrophy noted Sensory: Pinprick and light touch intact throughout, bilaterally Deep Tendon Reflexes:  Right: Upper Extremity   Left: Upper extremity   biceps (C-5 to C-6) 2/4   biceps (C-5 to C-6) 2/4 tricep (C7) 2/4    triceps (C7) 2/4 Brachioradialis (C6) 2/4  Brachioradialis (C6) 2/4  Lower Extremity Lower Extremity  quadriceps (L-2 to L-4) 2/4   quadriceps (L-2 to L-4)  2/4 Achilles (S1) 2/4   Achilles (S1) 2/4  Plantars: Right: downgoing   Left: downgoing Cerebellar: normal finger-to-nose,  normal heel-to-shin test Gait: able to ambulate without walker.  Slow hesitant steps but no imbalance.  CV: pulses palpable throughout    Lab Results: Basic Metabolic Panel:  Recent Labs Lab 11/01/14 1140 11/02/14 0042 11/03/14 0548  NA 137  --  134*  K 4.4  --  3.8  CL 100*  --  99*  CO2 28  --  23  GLUCOSE 115*  --  158*  BUN 13  --  13  CREATININE 1.17  --  1.19  CALCIUM 10.2  --  9.2  MG  --  2.0  --     Liver Function Tests:  Recent Labs Lab 11/01/14 1140  AST 39  ALT 79*  ALKPHOS 70  BILITOT 0.8  PROT 8.8*  ALBUMIN 4.5   No results for input(s): LIPASE, AMYLASE in the last 168 hours. No results for input(s): AMMONIA in the last 168 hours.  CBC:  Recent Labs Lab 11/01/14 1140  WBC 3.0*  NEUTROABS 1.5*  HGB 15.2  HCT 45.0  MCV 90.4  PLT 227    Cardiac Enzymes:  Recent Labs Lab 11/02/14 0042 11/03/14 0548  CKTOTAL 458* 365    Lipid Panel: No results for input(s): CHOL, TRIG, HDL, CHOLHDL, VLDL, LDLCALC in the last 168 hours.  CBG:  Recent Labs Lab 11/01/14 1142  GLUCAP 106*    Microbiology: No results found for this or any previous visit.  Coagulation Studies:  No results for input(s): LABPROT, INR in the last 72 hours.  Imaging: Dg Chest 2 View  11/01/2014   CLINICAL DATA:  Chest pain, shortness of breath, headache, weakness and dizziness for 4 days, history hypertension  EXAM: CHEST  2 VIEW  COMPARISON:  None  FINDINGS: Normal heart size, mediastinal contours, and pulmonary vascularity.  Lungs clear.  No pleural effusion or pneumothorax.  Minimal levoconvex scoliosis of the upper thoracic spine.  IMPRESSION: No acute abnormalities.   Electronically Signed   By: Ulyses Southward M.D.   On: 11/01/2014 16:06   Ct Head Wo Contrast  11/01/2014   CLINICAL DATA:  Generalized weakness, LEFT side extremity weakness and  slurring words since Thursday, history hypertension  EXAM: CT HEAD WITHOUT CONTRAST  TECHNIQUE: Contiguous axial images were obtained from the base of the skull through the vertex without intravenous contrast.  COMPARISON:  None  FINDINGS: Normal ventricular morphology.  No midline shift or mass effect.  Normal appearance of brain parenchyma.  No intracranial hemorrhage, mass lesion, or acute infarction.  Visualized paranasal sinuses and mastoid air cells clear.  Bones unremarkable.  IMPRESSION: Normal exam.   Electronically Signed   By: Ulyses Southward M.D.   On: 11/01/2014 17:16   [name] Contrast  11/01/2014   CLINICAL DATA:  Weakness and headache. Slurred speech and left-sided droop.  EXAM: MRI HEAD WITHOUT CONTRAST  TECHNIQUE: Multiplanar, multiecho pulse sequences of the brain and surrounding structures were obtained without intravenous contrast.  COMPARISON:  Head CT earlier today  FINDINGS: There is no acute infarct. Ventricles and sulci are normal for age. There is no evidence of intracranial hemorrhage, mass, midline shift, or extra-axial fluid collection. No brain parenchymal signal abnormality is identified.  Orbits are unremarkable. Minimal left maxillary sinus mucosal thickening is noted. No significant mastoid effusion. Major intracranial vascular flow voids are preserved. Calvarium and scalp soft tissues are unremarkable.  IMPRESSION: Unremarkable appearance of the brain.   Electronically Signed   By: Sebastian Ache   On: 11/01/2014 20:07   Mr Cervical Spine Wo Contrast  11/02/2014   CLINICAL DATA:  Weakness and headache since Thursday with paraparesis of both lower limbs.  EXAM: MRI CERVICAL SPINE WITHOUT CONTRAST  TECHNIQUE: Multiplanar, multisequence MR imaging of the cervical spine was performed. No intravenous contrast was administered.  COMPARISON:  None.  FINDINGS: No marrow signal abnormality suggestive of fracture, infection, or neoplasm. 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 Normal cord signal and morphology. No notable extra-spinal findings.  Degenerative changes:  C2-3: Unremarkable.  C3-4: Minimal uncovertebral spurring without stenosis.  C4-5: Mild degenerative disc change with anterior annular fissure, likely accelerated by subjacent non segmentation. There is mild right uncovertebral spurring without significant foraminal stenosis. Mild posterior disc bulging.  C5-6: Non segmentation with widely patent canal and foramina  C6-7: Small uncovertebral spurs without stenosis.  C7-T1:Unremarkable.  IMPRESSION: 1. Normal cervical cord. 2. Early uncovertebral spurring above and below C5-6 non segmentation. No significant canal or foraminal stenosis.   Electronically Signed   By: Marnee Spring M.D.   On: 11/02/2014 10:38   Mr Thoracic Spine Wo Contrast  11/02/2014   CLINICAL DATA:  Initial evaluation for acute weakness and headache.  EXAM: MRI THORACIC AND LUMBAR SPINE WITHOUT CONTRAST  TECHNIQUE: Multiplanar and multiecho pulse sequences of the thoracic and lumbar spine were obtained without intravenous contrast.  COMPARISON:  None available.  FINDINGS: MR THORACIC SPINE FINDINGS  Vertebral  bodies are normally aligned with preservation of the normal thoracic kyphosis. Vertebral body heights are well preserved. No fracture or listhesis. No focal osseous lesion. No bone marrow edema.  Signal intensity within these thoracic spinal cord is normal.  Paraspinous soft tissues are within normal limits. Partially visualized lungs are clear.  No significant degenerative disc disease identified within the thoracic spine. No canal or foraminal stenosis.  MR LUMBAR SPINE FINDINGS  Vertebral bodies are normally aligned with preservation of the normal lumbar lordosis. Vertebral body heights are well preserved. Signal intensity within the vertebral body bone marrow is normal. No marrow edema. No focal osseous lesion. No fracture or listhesis.  Conus  medullaris terminates normally at the L1 level. Signal intensity within the visualized cord is normal. Nerve roots of the cauda equina are within normal limits.  Paraspinous soft tissues are unremarkable.  No significant degenerative disc disease identified within the lumbar spine. Intervertebral discs are well hydrated. No focal disc herniation. Mild bilateral facet arthrosis present at L5-S1. No significant stenosis identified.  IMPRESSION: MR THORACIC SPINE IMPRESSION  Normal MRI of the thoracic spine.  MR LUMBAR SPINE IMPRESSION  1. No acute abnormality within the lumbar spine. 2. No significant stenosis identified within the lumbar spine. 3. Mild bilateral facet arthrosis at L5-S1. No others significant degenerative changes identified. No significant canal or foraminal stenosis.   Electronically Signed   By: Rise Mu M.D.   On: 11/02/2014 00:01   Mr Lumbar Spine Wo Contrast  11/02/2014   CLINICAL DATA:  Initial evaluation for acute weakness and headache.  EXAM: MRI THORACIC AND LUMBAR SPINE WITHOUT CONTRAST  TECHNIQUE: Multiplanar and multiecho pulse sequences of the thoracic and lumbar spine were obtained without intravenous contrast.  COMPARISON:  None available.  FINDINGS: MR THORACIC SPINE FINDINGS  Vertebral bodies are normally aligned with preservation of the normal thoracic kyphosis. Vertebral body heights are well preserved. No fracture or listhesis. No focal osseous lesion. No bone marrow edema.  Signal intensity within these thoracic spinal cord is normal.  Paraspinous soft tissues are within normal limits. Partially visualized lungs are clear.  No significant degenerative disc disease identified within the thoracic spine. No canal or foraminal stenosis.  MR LUMBAR SPINE FINDINGS  Vertebral bodies are normally aligned with preservation of the normal lumbar lordosis. Vertebral body heights are well preserved. Signal intensity within the vertebral body bone marrow is normal. No marrow  edema. No focal osseous lesion. No fracture or listhesis.  Conus medullaris terminates normally at the L1 level. Signal intensity within the visualized cord is normal. Nerve roots of the cauda equina are within normal limits.  Paraspinous soft tissues are unremarkable.  No significant degenerative disc disease identified within the lumbar spine. Intervertebral discs are well hydrated. No focal disc herniation. Mild bilateral facet arthrosis present at L5-S1. No significant stenosis identified.  IMPRESSION: MR THORACIC SPINE IMPRESSION  Normal MRI of the thoracic spine.  MR LUMBAR SPINE IMPRESSION  1. No acute abnormality within the lumbar spine. 2. No significant stenosis identified within the lumbar spine. 3. Mild bilateral facet arthrosis at L5-S1. No others significant degenerative changes identified. No significant canal or foraminal stenosis.   Electronically Signed   By: Rise Mu M.D.   On: 11/02/2014 00:01    Medications:  Scheduled: . heparin  5,000 Units Subcutaneous 3 times per day  . hydrochlorothiazide  25 mg Oral Daily  . lisinopril  20 mg Oral Daily   Felicie Morn PA-C [facility] [phone]  11/03/2014, 10:34 AM Patient seen and examined.  Clinical course and management discussed.  Necessary edits performed.  I agree with the above.  Assessment and plan of care developed and discussed below.     Assessment/Plan: Symptoms have continued to improve while in hospital.  Now walking without assistance. MRI brain, cervical,thoracic and lumbar spines personally reviewed and show no acute changes or abnormalities that could explain patient's weakness.    Recommendations: 1.  No further neurologic intervention is recommended at this time.  If further questions arise, please call or page at that time.  Thank you for allowing neurology to participate in the care of this patient.    Thana FarrLeslie Shalimar Mcclain, MD [facility] [phone] 11/03/2014  10:43  AM

## 2014-11-03 NOTE — Consult Note (Signed)
Lourie Retz, EdD 

## 2014-11-03 NOTE — Progress Notes (Signed)
NURSING PROGRESS NOTE  Ian Thomas [id number] Discharge Data: 11/03/2014 2:06 PM Attending Provider: No att. providers found PCP:No PCP Per Patient   Ian NasutiKevin Pichardo to be D/C'd Home per MD order with hard copies of all prescriptions, and work note signed by MD.    All IV's will be discontinued and monitored for bleeding.  All belongings will be returned to patient for patient to take home.  Last Documented Vital Signs:  Blood pressure 122/82, pulse 92, temperature 97.9 F (36.6 C), temperature source Oral, resp. rate 18, height 5' 10.8" (1.798 m), weight 102.422 kg (225 lb 12.8 oz), SpO2 100 %.  Leane PlattSpencer Susette Seminara RN, BS, BSN

## 2014-11-03 NOTE — Progress Notes (Signed)
Physical Therapy Treatment Patient Details Name: Ian Thomas MRN: [id number] DOB: 11/08/1980 Today's Date: 11/03/2014    History of Present Illness Ian Thomas is an 34 y.o. male with a past medical history significant for HTN, comes in for further evaluation of the above stated symptoms. He is accompanied by family members.    PT Comments    Noting good progress, and it is likely that the most therapeutic thing for Ian Thomas is to highlight and celebrate progress; Emphasized to pt the need to have consistent follow-up with primary care MD;   Updated dc recommendations; Spoke with Dr. Butler Denmark  See vitals flow sheet for BPs with activity.   Follow Up Recommendations  Outpatient PT     Equipment Recommendations  None recommended by PT    Recommendations for Other Services       Precautions / Restrictions Precautions Precautions: Fall Restrictions Weight Bearing Restrictions: No    Mobility  Bed Mobility                  Transfers Overall transfer level: Modified independent   Transfers: Sit to/from Stand           General transfer comment: Slow  Ambulation/Gait Ambulation/Gait assistance: Supervision Ambulation Distance (Feet): 100 Feet (x2) Assistive device: None Gait Pattern/deviations: Step-through pattern;Decreased stride length;Decreased step length - right;Decreased step length - left Gait velocity: slow   General Gait Details: extremely slow moving; cues to self-monitor for activity tolerance   Stairs Stairs: Yes Stairs assistance: Min guard Stair Management: One rail Right Number of Stairs: 24 General stair comments: verbal and demo cues for technique; cues to self-monitor for activity tolerance  Wheelchair Mobility    Modified Rankin (Stroke Patients Only)       Balance     Sitting balance-Leahy Scale: Fair       Standing balance-Leahy Scale: Fair                      Cognition Arousal/Alertness:  Awake/alert Behavior During Therapy: WFL for tasks assessed/performed (noted slightly anxious) Overall Cognitive Status: Within Functional Limits for tasks assessed                      Exercises      General Comments        Pertinent Vitals/Pain Pain Assessment: No/denies pain    Home Living                      Prior Function            PT Goals (current goals can now be found in the care plan section) Acute Rehab PT Goals Patient Stated Goal: home PT Goal Formulation: With patient Time For Goal Achievement: 11/09/14 Potential to Achieve Goals: Good Progress towards PT goals: Progressing toward goals    Frequency  Min 4X/week    PT Plan Discharge plan needs to be updated    Co-evaluation             End of Session   Activity Tolerance: Patient tolerated treatment well Patient left: in chair;with call bell/phone within reach     Time: 1036-1120 PT Time Calculation (min) (ACUTE ONLY): 44 min  Charges:  $Gait Training: 38-52 mins                    G Codes:      Olen Pel 11/03/2014, 11:58 AM  Van Clines, PT  Acute  Rehabilitation Services Pager [phone] Office [phone]

## 2014-11-03 NOTE — Care Management Note (Signed)
Case Management Note  Patient Details  Name: Bonnita NasutiKevin Cueva MRN: [id number] Date of Birth: 05/21/1981  Subjective/Objective:             Patient is from home, per physical therapy rec hhpt or outpt pt, NCM spoke with patient and he states he would like to have outpt physical therapy scheduled for him and he would try to call patient accounting to see if he could get any ast with that.  NCM sent referral for outpt pt to Neuro rehabilitation thru epic.  Also patient states he does not want a rolling walker.  [facility] where he can also go pick up his medications at dc.   Patient is for dc today.  No other needs identified.   Action/Plan:        Expected Discharge Date:  11/03/14               Expected Discharge Plan:  Home/Self Care  In-House Referral:     Discharge planning Services  CM Consult, Follow-up appt scheduled, [facility]  Post Acute Care Choice:    Choice offered to:     DME Arranged:    DME Agency:     HH Arranged:    HH Agency:     Status of Service:  Completed, signed off  Medicare Important Message Given:  No Date Medicare IM Given:    Medicare IM give by:    Date Additional Medicare IM Given:    Additional Medicare Important Message give by:     If discussed at Long Length of Stay Meetings, dates discussed:    Additional Comments:  Leone Havenaylor, Shaylen Nephew [place], RN 11/03/2014, 12:10 PM

## 2014-11-08 ENCOUNTER — Encounter: Payer: Self-pay | Admitting: Family Medicine

## 2014-11-08 ENCOUNTER — Ambulatory Visit: Payer: Self-pay | Attending: Family Medicine | Admitting: Family Medicine

## 2014-11-08 VITALS — BP 134/87 | HR 91 | Temp 98.4°F | Resp 18 | Ht 69.0 in | Wt 228.0 lb

## 2014-11-08 DIAGNOSIS — I10 Essential (primary) hypertension: Secondary | ICD-10-CM

## 2014-11-08 DIAGNOSIS — G44209 Tension-type headache, unspecified, not intractable: Secondary | ICD-10-CM

## 2014-11-08 DIAGNOSIS — R519 Headache, unspecified: Secondary | ICD-10-CM | POA: Insufficient documentation

## 2014-11-08 DIAGNOSIS — R531 Weakness: Secondary | ICD-10-CM

## 2014-11-08 DIAGNOSIS — R51 Headache: Secondary | ICD-10-CM

## 2014-11-08 LAB — LIPID PANEL
CHOLESTEROL: 228 mg/dL — AB (ref 0–200)
HDL: 62 mg/dL (ref >=40)
LDL Cholesterol: 149 mg/dL — ABNORMAL HIGH (ref 0–99)
Total CHOL/HDL Ratio: 3.7 Ratio
Triglycerides: 87 mg/dL (ref <150)
VLDL: 17 mg/dL (ref 0–40)

## 2014-11-08 NOTE — Progress Notes (Signed)
Subjective:    Patient ID: Ian Thomas, male    DOB: 01/13/1981, 34 y.o.   MRN: [id number]  HPI  Admit date: 11/01/14 Discharge date: 11/03/14  Ian Thomas had presented to [facility] with headaches and generalized weakness and had complained of a systolic blood pressure greater than 200 and so he took a friend's blood pressure medication. He had endorsed previously being on antihypertensives which he had stopped due to lack of insurance. On presentation in the ER he was noted to continue to have significant difficulty with a complaint of heaviness of his legs and weakness when attempting to ambulate and his SBP was 120-140; he had a CT scan and MRI of his brain which came back unremarkable. MRI L-spine revealed bilateral facet arthrosis at L5-S1, MRI of thoracic spine was normal, MRI C- spine revealed uncovertebral spurring above and below C5-6. He was seen by Neurology and symptoms were thought to be due to conversion disorder and he was discharged with plans to follow up with outpatient PT.  Interval History: Complains of headache 7/10 on the bilateral temples and states the headaches occur when his blood pressure is elevated; he is still weak- (generalized) but states he is at about 85% of baseline. Denies photophobia, nausea or vomiting. Sometimes headaches radiate to occipital region. He has had tension headaches in the past but has never had a migraine headache.  Past Medical History  Diagnosis Date  . Hypertension     Past Surgical History  Procedure Laterality Date  . Knee surgery Right 1998    History   Social History  . Marital Status: Married    Spouse Name: N/A  . Number of Children: N/A  . Years of Education: N/A   Occupational History  . Not on file.   Social History Main Topics  . Smoking status: Never Smoker   . Smokeless tobacco: Not on file  . Alcohol Use: No  . Drug Use: No  . Sexual Activity: Not on file   Other Topics Concern  . Not on file    Social History Narrative    Family History  Problem Relation Age of Onset  . Hypertension Mother   . Hypertension Father   . Diabetes Sister   . Heart attack Neg Hx   . Hyperlipidemia Neg Hx   . Sudden death Neg Hx     No Known Allergies  Current Outpatient Prescriptions on File Prior to Visit  Medication Sig Dispense Refill  . lisinopril-hydrochlorothiazide (ZESTORETIC) 20-12.5 MG per tablet Take 1 tablet by mouth daily. 30 tablet 3   No current facility-administered medications on file prior to visit.        Review of Systems  Constitutional: Negative for activity change and appetite change.  HENT: Negative for sinus pressure and sore throat.   Eyes: Negative for visual disturbance.  Respiratory: Negative for chest tightness and shortness of breath.   Cardiovascular: Negative for chest pain and palpitations.  Gastrointestinal: Negative for abdominal pain and abdominal distention.  Endocrine: Negative for cold intolerance, heat intolerance and polyphagia.  Genitourinary: Negative for dysuria, frequency and difficulty urinating.  Musculoskeletal: Negative for back pain, joint swelling and arthralgias.  Skin: Negative for color change.  Neurological: Positive for headaches. Negative for dizziness, tremors and weakness.  Psychiatric/Behavioral: Negative for suicidal ideas and behavioral problems.         Objective: Filed Vitals:   11/08/14 1046  BP: 134/87  Pulse: 91  Temp: 98.4 F (36.9 C)  Resp: 18      Physical Exam  Constitutional: He is oriented to person, place, and time. He appears well-developed and well-nourished.  HENT:  Head: Normocephalic and atraumatic.  Right Ear: External ear normal.  Left Ear: External ear normal.  Eyes: Conjunctivae and EOM are normal. Pupils are equal, round, and reactive to light.  Neck: Normal range of motion. Neck supple. No tracheal deviation present.  Cardiovascular: Normal rate, regular rhythm and normal heart  sounds.   No murmur heard. Pulmonary/Chest: Effort normal and breath sounds normal. No respiratory distress. He has no wheezes. He exhibits no tenderness.  Abdominal: Soft. Bowel sounds are normal. He exhibits no mass. There is no tenderness.  Musculoskeletal: Normal range of motion. He exhibits no edema or tenderness.  Neurological: He is alert and oriented to person, place, and time.  Skin: Skin is warm and dry.  Psychiatric: He has a normal mood and affect.      EXAM: MRI HEAD WITHOUT CONTRAST  TECHNIQUE: Multiplanar, multiecho pulse sequences of the brain and surrounding structures were obtained without intravenous contrast.  COMPARISON: Head CT earlier today  FINDINGS: There is no acute infarct. Ventricles and sulci are normal for age. There is no evidence of intracranial hemorrhage, mass, midline shift, or extra-axial fluid collection. No brain parenchymal signal abnormality is identified.  Orbits are unremarkable. Minimal left maxillary sinus mucosal thickening is noted. No significant mastoid effusion. Major intracranial vascular flow voids are preserved. Calvarium and scalp soft tissues are unremarkable.  IMPRESSION: Unremarkable appearance of the brain.   Electronically Signed  By: Sebastian Ache  On: 11/01/2014 20:07   EXAM: MRI THORACIC AND LUMBAR SPINE WITHOUT CONTRAST  TECHNIQUE: Multiplanar and multiecho pulse sequences of the thoracic and lumbar spine were obtained without intravenous contrast.  COMPARISON: None available.  FINDINGS: MR THORACIC SPINE FINDINGS  Vertebral bodies are normally aligned with preservation of the normal thoracic kyphosis. Vertebral body heights are well preserved. No fracture or listhesis. No focal osseous lesion. No bone marrow edema.  Signal intensity within these thoracic spinal cord is normal.  Paraspinous soft tissues are within normal limits. Partially visualized lungs are clear.  No  significant degenerative disc disease identified within the thoracic spine. No canal or foraminal stenosis.   MR LUMBAR SPINE FINDINGS  Vertebral bodies are normally aligned with preservation of the normal lumbar lordosis. Vertebral body heights are well preserved. Signal intensity within the vertebral body bone marrow is normal. No marrow edema. No focal osseous lesion. No fracture or listhesis.  Conus medullaris terminates normally at the L1 level. Signal intensity within the visualized cord is normal. Nerve roots of the cauda equina are within normal limits.  Paraspinous soft tissues are unremarkable.  No significant degenerative disc disease identified within the lumbar spine. Intervertebral discs are well hydrated. No focal disc herniation. Mild bilateral facet arthrosis present at L5-S1. No significant stenosis identified.  IMPRESSION: MR THORACIC SPINE IMPRESSION  Normal MRI of the thoracic spine.  MR LUMBAR SPINE IMPRESSION  1. No acute abnormality within the lumbar spine. 2. No significant stenosis identified within the lumbar spine. 3. Mild bilateral facet arthrosis at L5-S1. No others significant degenerative changes identified. No significant canal or foraminal stenosis.   Electronically Signed  By: Rise Mu M.D.  On: 11/02/2014 00:01      Assessment & Plan:  34 year old male with newly diagnosed hypertension recently hospitalized for generalized weakness and hypertensive emergency with workup unrevealing for a CVA.  Hypertension: Controlled. Continue medications; lipid panel sent  off.  Generalized weakness: Improving. Imaging negative for TIA versus stroke. No indication for PT at this time.  Headaches: Unclear etiology given blood pressure is normal at this time. Will need to exclude tension headaches versus headaches secondary to sleep apnea; if headaches persist he would need to be referred for sleep study.

## 2014-11-08 NOTE — Progress Notes (Signed)
Patient hospitalized for high blood pressure and weakness. Patient reports going to ED due to not being able to walk, having excruciating headaches and right eye was closing. 2 weeks ago left side of body was weak, slurred speech and headaches. Patient reports currently having headache at level 7. Patient was taken to ED about 9 months ago for not feeling well, sugar was extremely low on that visit.

## 2014-11-08 NOTE — Patient Instructions (Signed)

## 2014-11-09 ENCOUNTER — Telehealth: Payer: Self-pay

## 2014-11-09 ENCOUNTER — Other Ambulatory Visit: Payer: Self-pay | Admitting: Family Medicine

## 2014-11-09 MED ORDER — PRAVASTATIN SODIUM 20 MG PO TABS: 20.0000 mg | ORAL_TABLET | Freq: Every day | ORAL | Status: AC

## 2014-11-09 NOTE — Progress Notes (Signed)
Elevated lipids and so I am placing him on a statin.

## 2014-11-09 NOTE — Telephone Encounter (Signed)
Nurse called patient, reached voicemail. Left message for patient to call Rosaria Kubin at [phone].   

## 2014-11-09 NOTE — Telephone Encounter (Signed)
-----   Message from Enobong Amao, MD sent at 11/09/2014  8:33 AM EDT ----- Please inform him that his cholesterol is elevated and so I have placed him on a statin which I have sent to his pharmacy. 

## 2014-11-10 NOTE — Telephone Encounter (Signed)
Nurse called patient, patient verified date of birth. Patient aware of elevated cholesterol and statin at pharmacy. Patient questioned cost of statin. Nurse advised patient to call pharmacy to ask price of medication. If medication is too expensive patient will return call to nurse and prescription can be sent to [facility]. Patient voices understanding and agrees to call nurse if pharmacy needs to be changed.

## 2014-11-10 NOTE — Telephone Encounter (Signed)
-----   Message from Jaclyn ShaggyEnobong Amao, MD sent at 11/09/2014  8:33 AM EDT ----- Please inform him that his cholesterol is elevated and so I have placed him on a statin which I have sent to his pharmacy.

## 2014-11-22 ENCOUNTER — Encounter: Payer: Self-pay | Admitting: Family Medicine

## 2014-11-22 ENCOUNTER — Ambulatory Visit: Payer: Medicaid Other | Attending: Family Medicine | Admitting: Family Medicine

## 2014-11-22 VITALS — BP 130/98 | HR 86 | Temp 98.4°F | Resp 20 | Ht 69.0 in | Wt 230.0 lb

## 2014-11-22 DIAGNOSIS — E785 Hyperlipidemia, unspecified: Secondary | ICD-10-CM | POA: Diagnosis not present

## 2014-11-22 DIAGNOSIS — I10 Essential (primary) hypertension: Secondary | ICD-10-CM | POA: Diagnosis present

## 2014-11-22 DIAGNOSIS — J329 Chronic sinusitis, unspecified: Secondary | ICD-10-CM | POA: Insufficient documentation

## 2014-11-22 DIAGNOSIS — B353 Tinea pedis: Secondary | ICD-10-CM | POA: Diagnosis not present

## 2014-11-22 DIAGNOSIS — J322 Chronic ethmoidal sinusitis: Secondary | ICD-10-CM | POA: Insufficient documentation

## 2014-11-22 HISTORY — DX: Hyperlipidemia, unspecified: E78.5

## 2014-11-22 MED ORDER — CEPHALEXIN 500 MG PO CAPS: 500.0000 mg | ORAL_CAPSULE | Freq: Two times a day (BID) | ORAL | Status: DC

## 2014-11-22 MED ORDER — TERBINAFINE HCL 1 % EX CREA: 1.0000 "application " | TOPICAL_CREAM | Freq: Two times a day (BID) | CUTANEOUS | Status: AC

## 2014-11-22 MED ORDER — FLUTICASONE PROPIONATE 50 MCG/ACT NA SUSP: 1.0000 | Freq: Every day | NASAL | Status: AC

## 2014-11-22 NOTE — Progress Notes (Signed)
Subjective:    Patient ID: Ian Thomas, male    DOB: 07/23/1980, 34 y.o.   MRN: [id number]  HPI  Ian Thomas was seen at his last office visit for a hospital follow-up for accelerated hypertension and weakness with the suspicion of TIA; diagnostic studies during hospitalization were negative for stroke. He was commenced on antihypertensives, fasting labs revealed elevated lipids and he was placed on a statin. He had also complained of headaches at his last visit and there was a suspicion for sleep apnea but given the lack of health coverage he had decided to place sleep study on hold.  Today his blood pressure is much better and he has been compliant with his antihypertensive as well as his statin. He doesn't have as much headaches but does endorse sinus congestion, postnasal drip mostly when he lies flat. He has no pedal edema, chest pain, dyspnea. He has swelling and callus formation in his fourth webspace of the right foot and states he had taken off some callus with resulting pain and swelling to which he has been applying cornstarch.   Past Medical History  Diagnosis Date  . Hypertension   . Hyperlipidemia 11/22/2014    Past Surgical History  Procedure Laterality Date  . Knee surgery Right 1998    Family History  Problem Relation Age of Onset  . Hypertension Mother   . Hypertension Father   . Diabetes Sister   . Heart attack Neg Hx   . Hyperlipidemia Neg Hx   . Sudden death Neg Hx     No Known Allergies  Current Outpatient Prescriptions on File Prior to Visit  Medication Sig Dispense Refill  . lisinopril-hydrochlorothiazide (ZESTORETIC) 20-12.5 MG per tablet Take 1 tablet by mouth daily. 30 tablet 3  . pravastatin (PRAVACHOL) 20 MG tablet Take 1 tablet (20 mg total) by mouth daily. 30 tablet 3   No current facility-administered medications on file prior to visit.      Review of Systems  General: negative for fever, weight loss, appetite change Eyes: no visual  symptoms. ENT: see HPI Neck: no pain  Respiratory: no wheezing, shortness of breath, cough Cardiovascular: no chest pain, no dyspnea on exertion, no pedal edema, no orthopnea. Gastrointestinal: no abdominal pain, no diarrhea, no constipation Genito-Urinary: no urinary frequency, no dysuria, no polyuria. Hematologic: no bruising Endocrine: no cold or heat intolerance Neurological: no headaches, no seizures, no tremors Musculoskeletal: no joint pains, no joint swelling Skin: see HPI Psychological: no depression, no anxiety,      Objective: Filed Vitals:   11/22/14 0941  BP: 130/98  Pulse: 86  Temp: 98.4 F (36.9 C)  TempSrc: Oral  Resp: 20  Height: 5\' 9"  (1.753 m)  Weight: 230 lb (104.327 kg)  SpO2: 95%      Physical Exam  Constitutional: normal appearing,  Eyes: PERRLA HEENT: Head is atraumatic, normal sinuses, normal oropharynx, normal appearing tonsils and palate, tympanic membrane is normal bilaterally. Neck: normal range of motion, no thyromegaly, no JVD Cardiovascular: normal rate and rhythm, normal heart sounds, no murmurs, rub or gallop, no pedal edema Respiratory: clear to auscultation bilaterally, no wheezes, no rales, no rhonchi Abdomen: soft, not tender to palpation, normal bowel sounds, no enlarged organs Extremities: Full ROM, no tenderness in joints Skin: Fourth webspace of the right foot with callus formation, edema of the fifth toe, mild maceration with whitish deposits. Tender to palpation no erythema appreciated.. Neurological: alert, oriented x3, cranial nerves I-XII grossly intact , normal motor strength, normal sensation.  Psychological: normal mood.   Lipid Panel     Component Value Date/Time   CHOL 228* 11/08/2014 1120   TRIG 87 11/08/2014 1120   HDL 62 11/08/2014 1120   CHOLHDL 3.7 11/08/2014 1120   VLDL 17 11/08/2014 1120   LDLCALC 149* 11/08/2014 1120    CMP Latest Ref Rng 11/03/2014 11/01/2014 04/20/2014  Glucose 70 - 99 mg/dL 119(J) 478(G)  96  BUN 6 - 20 mg/dL Creatinine 0.61 - 1.24 mg/dL 9.56 2.13 0.86  Sodium 135 - 145 mmol/L 134(L) 137 137  Potassium 3.5 - 5.1 mmol/L 3.8 4.4 4.1  Chloride 101 - 111 mmol/L 99(L) 100(L) 99  CO2 22 - 32 mmol/L Calcium 8.9 - 10.3 mg/dL 9.2 57.8 9.5  Total Protein 6.5 - 8.1 g/dL - 8.8(H) -  Total Bilirubin 0.3 - 1.2 mg/dL - 0.8 -  Alkaline Phos 38 - 126 U/L - 70 -  AST 15 - 41 U/L - 39 -  ALT 17 - 63 U/L - 79(H) -         Assessment & Plan:  34 year old male with a history of hypertension, hyperlipidemia, chronic sinusitis and tinea pedis with superimposed inflammatory changes secondary to traumatic removal of callus.  Hypertension: Controlled continue medications.  Hyperlipidemia: Uncontrolled, continue statin. Advised on lifestyle changes, low-cholesterol diet, reducing foods high in transfats.  Chronic sinusitis: This could explain his headaches. I have advised him to obtain Zyrtec over-the-counter I am adding Flonase to his regimen. If Headache persists we will have to consider a  referral for sleep study.  Tinea pedis of the right foot with superimposed inflammatory changes: Cannot exclude early signs of cellulitis and so I have placed him on Keflex. Will also place on topical antifungal and advised to keep fourth webspace well aerated will see back at  next visit for a follow-up.  Disclaimer: This note was dictated with voice recognition software. Similar sounding words can inadvertently be transcribed and this note may contain transcription errors which may not have been corrected upon publication of note.

## 2014-11-22 NOTE — Progress Notes (Signed)
Patient here for follow up of BP. Patient reports having something between right 4th and 5th toes. Patient reports having pain between toes at level 10, described as aching. Patient reports "I feel really good" when asked if he is feeling better. Patient would like to know what he should take for allergy problems that will not interact with current medications.

## 2014-11-25 ENCOUNTER — Ambulatory Visit (HOSPITAL_COMMUNITY)
Admission: RE | Admit: 2014-11-25 | Discharge: 2014-11-25 | Disposition: A | Discharge status: Home / Self Care | Payer: Medicaid Other | Source: Ambulatory Visit | Attending: Family Medicine | Admitting: Family Medicine

## 2014-11-25 ENCOUNTER — Telehealth: Payer: Self-pay | Admitting: General Practice

## 2014-11-25 ENCOUNTER — Ambulatory Visit (HOSPITAL_BASED_OUTPATIENT_CLINIC_OR_DEPARTMENT_OTHER): Payer: Medicaid Other | Admitting: Family Medicine

## 2014-11-25 VITALS — BP 139/87 | HR 94 | Wt 230.0 lb

## 2014-11-25 DIAGNOSIS — M79671 Pain in right foot: Secondary | ICD-10-CM | POA: Insufficient documentation

## 2014-11-25 DIAGNOSIS — M25571 Pain in right ankle and joints of right foot: Secondary | ICD-10-CM

## 2014-11-25 DIAGNOSIS — M7989 Other specified soft tissue disorders: Secondary | ICD-10-CM | POA: Insufficient documentation

## 2014-11-25 MED ORDER — NAPROXEN 500 MG PO TABS: 500.0000 mg | ORAL_TABLET | Freq: Two times a day (BID) | ORAL | Status: AC

## 2014-11-25 MED ORDER — NAPROXEN 500 MG PO TABS: 500.0000 mg | ORAL_TABLET | Freq: Two times a day (BID) | ORAL | Status: DC

## 2014-11-25 NOTE — Progress Notes (Signed)
Pt c/o of right foot swollen for a couple of weeks.

## 2014-11-25 NOTE — Patient Instructions (Signed)
Continue to wear boot and use crutches for now Take naproxen with food twice a day You will get a bill for the x-ray but we can work on having it written off once you get your financial aid

## 2014-11-25 NOTE — Progress Notes (Signed)
Subjective:     Patient ID: Ian NasutiKevin Thomas, male   DOB: 08/28/1980, 34 y.o.   MRN: [id number]  HPI   Patient presents for swelling and pain in his right foot and toes. He was seen a week ago and was treated with anti-fungal for tinea pedis and an antibiotic for presumed infection. The pain and swelling has not subsided. He has a history of a stress fracture in the past.He is currently wearing an orthopedic boot and using crutches.  Review of Systems   Negative for fever, chills Negative for chest pain or palpitations Negative for cough or SOB Positive for problems with walking due to foot.     Objective:   Physical Exam  Alert, oriented, appropriate. Skin warm and dry Lungs are clear to auscultation Heart sounds are regular There is generalized swelling of the lateral aspect of the right foot. There is generalized tenderness. With increased tenderness at the base of the 4th toe.   There is no erythema or drainage.     Assessment:    Foot pain   Plan:    As he has been treated with an antibiotic with improvement, I am ordered an x-ray of foot and ankle as a starting point I am also starting him on naproxen 500 bid with food for inflammation Will follow-up with x-ray results when available Advise continuing boot and crutches with elevation of foot when sitting or lying.

## 2014-11-25 NOTE — Telephone Encounter (Signed)
Patients wife called stating that the patients right food is swollen and the patient is having a hard time walking. Please f/u

## 2014-11-30 NOTE — Telephone Encounter (Signed)
Patient had appointment with walk in provider.

## 2014-12-06 ENCOUNTER — Ambulatory Visit: Payer: Self-pay | Admitting: Family Medicine

## 2014-12-14 ENCOUNTER — Encounter: Payer: Self-pay | Admitting: Family Medicine

## 2014-12-14 ENCOUNTER — Ambulatory Visit: Payer: MEDICAID | Attending: Family Medicine | Admitting: Family Medicine

## 2014-12-14 VITALS — BP 137/91 | HR 89 | Temp 98.6°F | Resp 18 | Ht 69.0 in | Wt 231.8 lb

## 2014-12-14 DIAGNOSIS — B353 Tinea pedis: Secondary | ICD-10-CM

## 2014-12-14 DIAGNOSIS — L089 Local infection of the skin and subcutaneous tissue, unspecified: Secondary | ICD-10-CM

## 2014-12-14 NOTE — Progress Notes (Signed)
Patient is here for follow-up on toe infection. Patient reports that his toe is doing better. Patient reports that his wife pulled a long piece of hair out of toe and felt it and it was infected. Patient had blood and pus running out of the area after the hair was pulled out. Patient was seen here two week ago for it. Patient reports his foot smells bad and is accumulating lots of dead skin. Patient reports a small hole is located on his right foot between his pinky toe and adjacent toe. Patient reports pain at the crease of his pinky toe and adjacent toe. Patient rates pain at a 6 and describes it as aching pain occuring mostly when walking on it.

## 2014-12-14 NOTE — Progress Notes (Signed)
Subjective:    Patient ID: Ian Thomas, male    DOB: [birth year], 34 y.o.   MRN: [id number]  HPI    I had seen Bonnita Nasuti 3 weeks ago after he had presented with pain in his right fifth little toe and had a lesion that looked like a callus which he  Picked at with resulting pain and swelling but he had denied history of trauma. He was placed on Keflex for presumptive cellulitis and also on topical antifungal.  He returned  A week later to see  The  Nurse practitioner with worsening symptoms which included pain , swelling and he had difficulty weightbearing and was using crutches.   a foot x-ray was ordered which came back negative for bony involvement.  his wife tells me he had purulent discharge from the plantar aspect of the toe and she happened to pull out a strand of hair from an opening in the toe.  he reports his symptoms a bit better.  Past Medical History  Diagnosis Date  . Hypertension   . Hyperlipidemia 11/22/2014    Past Surgical History  Procedure Laterality Date  . Knee surgery Right 1998    History   Social History  . Marital Status: Married    Spouse Name: N/A  . Number of Children: N/A  . Years of Education: N/A   Occupational History  . Not on file.   Social History Main Topics  . Smoking status: Never Smoker   . Smokeless tobacco: Not on file  . Alcohol Use: No  . Drug Use: No  . Sexual Activity: Not on file   Other Topics Concern  . Not on file   Social History Narrative    No Known Allergies  Current Outpatient Prescriptions on File Prior to Visit  Medication Sig Dispense Refill  . lisinopril-hydrochlorothiazide (ZESTORETIC) 20-12.5 MG per tablet Take 1 tablet by mouth daily. 30 tablet 3  . pravastatin (PRAVACHOL) 20 MG tablet Take 1 tablet (20 mg total) by mouth daily. 30 tablet 3  . fluticasone (FLONASE) 50 MCG/ACT nasal spray Place 1 spray into both nostrils daily. (Patient not taking: Reported on 12/14/2014) 16 g 2  . naproxen  (NAPROSYN) 500 MG tablet Take 1 tablet (500 mg total) by mouth 2 (two) times daily with a meal. (Patient not taking: Reported on 12/14/2014) 30 tablet 0  . terbinafine (LAMISIL AT) 1 % cream Apply 1 application topically 2 (two) times daily. (Patient not taking: Reported on 11/25/2014) 30 g 0   No current facility-administered medications on file prior to visit.      Review of Systems  General: negative for fever, weight loss, appetite change Eyes: no visual symptoms. ENT: no ear symptoms, no sinus tenderness, no nasal congestion or sore throat. Neck: no pain  Respiratory: no wheezing, shortness of breath, cough Cardiovascular: no chest pain, no dyspnea on exertion, no pedal edema, no orthopnea. Gastrointestinal: no abdominal pain, no diarrhea, no constipation Genito-Urinary: no urinary frequency, no dysuria, no polyuria. Hematologic: no bruising Endocrine: no cold or heat intolerance Neurological: no headaches, no seizures, no tremors Musculoskeletal: see hpi Skin: see hpi Psychological: no depression, no anxiety,      Objective: Filed Vitals:   12/14/14 1526 12/14/14 1532  BP:  137/91  Pulse:  89  Temp:  98.6 F (37 C)  TempSrc:  Oral  Resp:  18  Height: 5\' 9"  (1.753 m)   Weight: 231 lb 12.8 oz (105.144 kg)   SpO2:  96%  Physical Exam Constitutional: normal appearing,  Eyes: PERRLA HEENT: Head is atraumatic, normal sinuses, normal oropharynx, normal appearing tonsils and palate, tympanic membrane is normal bilaterally. Neck: normal range of motion, no thyromegaly, no JVD Cardiovascular: normal rate and rhythm, normal heart sounds, no murmurs, rub or gallop, no pedal edema Respiratory: clear to auscultation bilaterally, no wheezes, no rales, no rhonchi Abdomen: soft, not tender to palpation, normal bowel sounds, no enlarged organs Extremities:  Right fourth webspace with macerated skin which is whitish no single area of erythema , right little toe with puncture wound  on plantar aspect which is mildly tender , no discharge noted.      Assessment & Plan:   34 year old male with history which  3 week history of right toe pain in keeping with tinea pedis with superimposed cellulitis , X-ray findings negative for bony abnormality.   Toe infection/tinea pedis :  He completed a course of Keflex and is currently on  Lamisil.  Given his history of worsening infection at home I am concerned and will need to exclude  osteomyelitis given protracted course.  meanwhile I have sent off  wound Culture. Advised to keep area dry.  This note has been created with Education officer, environmental. Any transcriptional errors are unintentional.

## 2014-12-17 LAB — WOUND CULTURE
GRAM STAIN: NONE SEEN
Gram Stain: NONE SEEN

## 2014-12-24 ENCOUNTER — Ambulatory Visit (HOSPITAL_COMMUNITY)
Admission: RE | Admit: 2014-12-24 | Discharge: 2014-12-24 | Disposition: A | Discharge status: Home / Self Care | Payer: Medicaid Other | Source: Ambulatory Visit | Attending: Family Medicine | Admitting: Family Medicine

## 2014-12-24 DIAGNOSIS — L089 Local infection of the skin and subcutaneous tissue, unspecified: Secondary | ICD-10-CM | POA: Diagnosis present

## 2014-12-27 ENCOUNTER — Telehealth: Payer: Self-pay | Admitting: *Deleted

## 2014-12-27 NOTE — Telephone Encounter (Signed)
-----   Message from Jaclyn ShaggyEnobong Amao, MD sent at 12/24/2014  9:35 PM EDT ----- Please inform the patient that MRI of his foot is normal. Thank you.

## 2014-12-27 NOTE — Telephone Encounter (Signed)
Left HIPAA compliant message for patient to return my call. 

## 2014-12-28 ENCOUNTER — Ambulatory Visit: Payer: Self-pay | Attending: Family Medicine | Admitting: Family Medicine

## 2014-12-28 ENCOUNTER — Encounter: Payer: Self-pay | Admitting: Family Medicine

## 2014-12-28 VITALS — BP 144/83 | HR 90 | Temp 98.2°F | Resp 18 | Ht 69.0 in | Wt 228.0 lb

## 2014-12-28 DIAGNOSIS — B353 Tinea pedis: Secondary | ICD-10-CM

## 2014-12-28 DIAGNOSIS — I10 Essential (primary) hypertension: Secondary | ICD-10-CM

## 2014-12-28 NOTE — Patient Instructions (Signed)

## 2014-12-28 NOTE — Progress Notes (Signed)
Patient here for follow up for toe infection. Patient denies any pain today. Patient reports his foot is getting better but it still isn't back to normal. Patient reports it is sore sometimes but it seems to be getting better.   Patient has taken BP medication today.

## 2014-12-28 NOTE — Progress Notes (Signed)
Subjective:    Patient ID: Ian Thomas, male    DOB: 08/12/1980, 34 y.o.   MRN: [id number]  HPI   Ian NasutiKevin Thomas is a 34 year old male with a history of hypertension who has had , right fourth toe webspace infection for which he completed a course of Keflex and has been on topical antifungals and has had a slow healing process. He was referred for an MRI of his foot at his last visit which was negative for abscess or osteomyelitis and wound culture came back negative for MRSA but revealed multiple organisms present.    He reports decreased pain and swelling and has noticed some improvement.  Past Medical History  Diagnosis Date  . Hypertension   . Hyperlipidemia 11/22/2014    Past Surgical History  Procedure Laterality Date  . Knee surgery Right 1998    History   Social History  . Marital Status: Married    Spouse Name: N/A  . Number of Children: N/A  . Years of Education: N/A   Occupational History  . Not on file.   Social History Main Topics  . Smoking status: Never Smoker   . Smokeless tobacco: Not on file  . Alcohol Use: No  . Drug Use: No  . Sexual Activity: Not on file   Other Topics Concern  . Not on file   Social History Narrative    No Known Allergies  Current Outpatient Prescriptions on File Prior to Visit  Medication Sig Dispense Refill  . lisinopril-hydrochlorothiazide (ZESTORETIC) 20-12.5 MG per tablet Take 1 tablet by mouth daily. 30 tablet 3  . naproxen (NAPROSYN) 500 MG tablet Take 1 tablet (500 mg total) by mouth 2 (two) times daily with a meal. 30 tablet 0  . pravastatin (PRAVACHOL) 20 MG tablet Take 1 tablet (20 mg total) by mouth daily. 30 tablet 3  . terbinafine (LAMISIL AT) 1 % cream Apply 1 application topically 2 (two) times daily. 30 g 0  . fluticasone (FLONASE) 50 MCG/ACT nasal spray Place 1 spray into both nostrils daily. (Patient not taking: Reported on 12/14/2014) 16 g 2   No current facility-administered medications on file prior  to visit.       Review of Systems   General: negative for fever, weight loss, appetite change Eyes: no visual symptoms. ENT: no ear symptoms, no sinus tenderness, no nasal congestion or sore throat. Neck: no pain  Respiratory: no wheezing, shortness of breath, cough Cardiovascular: no chest pain, no dyspnea on exertion, no pedal edema, no orthopnea. Gastrointestinal: no abdominal pain, no diarrhea, no constipation Genito-Urinary: no urinary frequency, no dysuria, no polyuria. Hematologic: no bruising Endocrine: no cold or heat intolerance Neurological: no headaches, no seizures, no tremors Musculoskeletal: no joint pains, no joint swelling Skin: see hpi Psychological: no depression, no anxiety,       Objective: Filed Vitals:   12/28/14 1030  BP: 144/83  Pulse: 90  Temp: 98.2 F (36.8 C)  TempSrc: Oral  Resp: 18  Height: 5\' 9"  (1.753 m)  Weight: 228 lb (103.42 kg)  SpO2: 97%      Physical Exam  Constitutional: normal appearing,  Cardiovascular: normal rate and rhythm, normal heart sounds, no murmurs, rub or gallop, no pedal edema Respiratory: clear to auscultation bilaterally, no wheezes, no rales, no rhonchi Feet: right 4th web space improved from previous visit, peeling of skin on plantar aspect  of toe and web space with less maceration.Other web spaces as well as left foot is normal.   CLINICAL  DATA: Open wound with drainage from the wound of the right little toe for approximately 3 weeks. Subsequent encounter.  EXAM: MRI OF THE RIGHT FOREFOOT WITHOUT CONTRAST  TECHNIQUE: Multiplanar, multisequence MR imaging was performed. No intravenous contrast was administered.  COMPARISON: Plain films of the right foot 11/25/2014.  FINDINGS: There is no bone marrow signal abnormality to suggest osteomyelitis. Mild soft tissue edema is seen over the dorsum of the foot. No focal fluid collection is identified. All imaged tendons and intrinsic musculature appear  normal. No notable arthropathy is seen. There is no mass.  IMPRESSION: Mild soft tissue swelling over the dorsum of the foot and about the right little toe. Negative for abscess or osteomyelitis.   Electronically Signed  By: Drusilla Kanner M.D.  On: 12/24/2014 12:36      Assessment & Plan:   34 year old male patient with hypertension and tinea pedis which show some improvement.   Hypertension:   Mild systolic elevation , continue antihypertensives and lifestyle changes.   tinea pedis : Improving   continue topical antifungal.  This note has been created with Education officer, environmental. Any transcriptional errors are unintentional.

## 2015-01-24 ENCOUNTER — Ambulatory Visit: Payer: Self-pay | Admitting: Family Medicine

## 2015-01-25 ENCOUNTER — Ambulatory Visit: Payer: Self-pay | Admitting: Family Medicine

## 2016-05-25 IMAGING — MR MR CERVICAL SPINE W/O CM
4 of 6 series · 19 of 48 positions shown · non-contrast
Comparison: None.

CLINICAL DATA: Weakness and headache since [REDACTED] with
paraparesis of both lower limbs.

EXAM:
MRI CERVICAL SPINE WITHOUT CONTRAST
TECHNIQUE: Multiplanar, multisequence MR imaging of the cervical spine was
performed. No intravenous contrast was administered.

[Series 2: T2 · sagittal · 3.0mm · 0.43mm/px · 5 of 14 slices shown (1 of 2)]
[im 1/14]
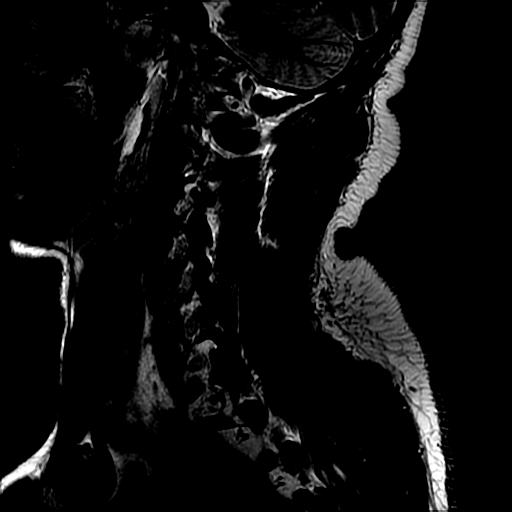
[im 4/14]
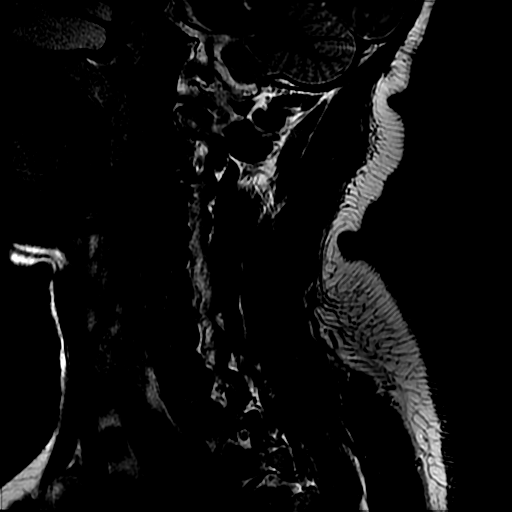
[im 7/14]
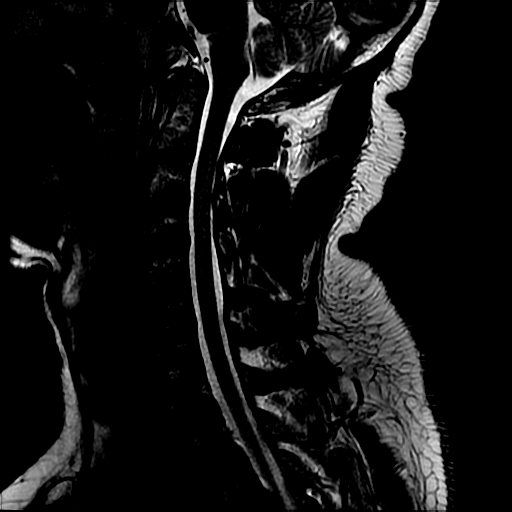
[im 10/14]
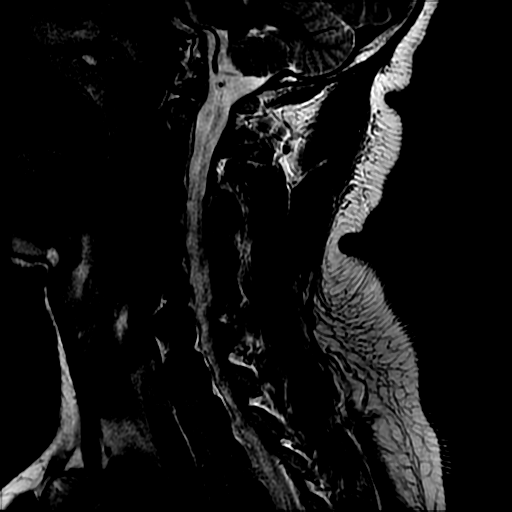
[im 14/14]
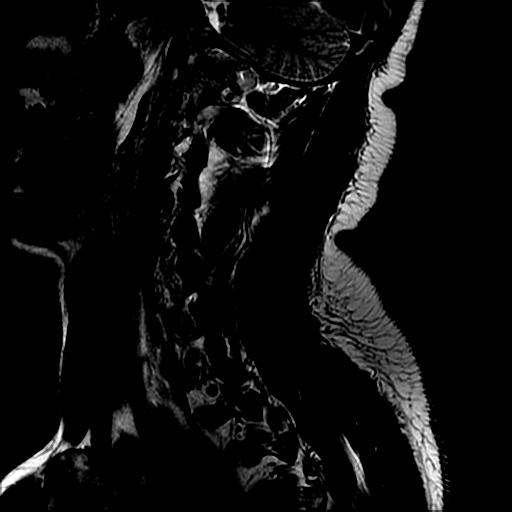

[Series 3: T1 · sagittal · 3.0mm · 0.43mm/px · 3 of 14 slices shown]
[im 1/14]
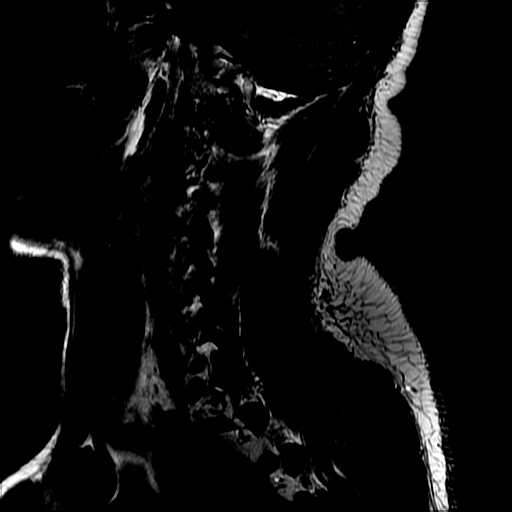
[im 7/14]
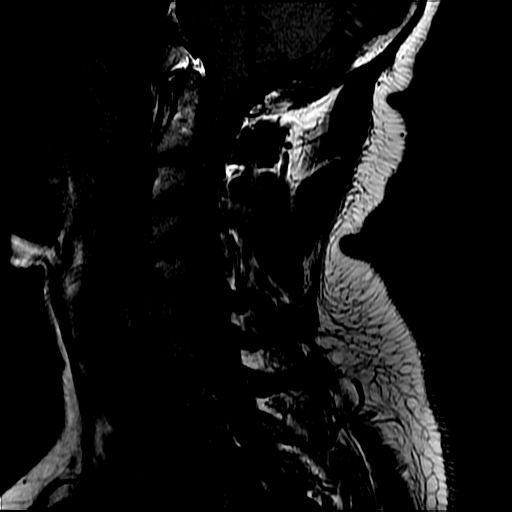
[im 14/14]
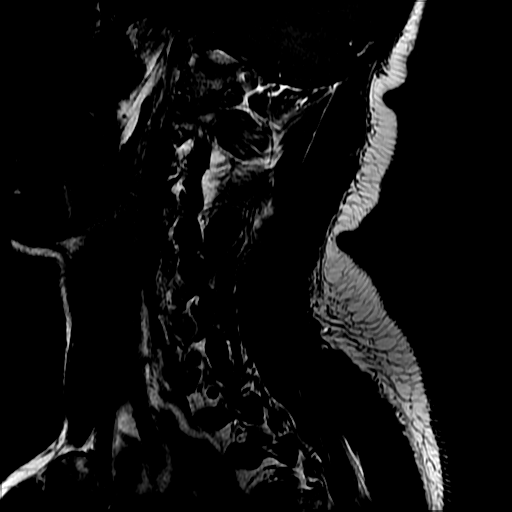

[Series 4: sag ir · sagittal · 3.0mm · 0.43mm/px · 3 of 7 slices shown]
[im 1/7]
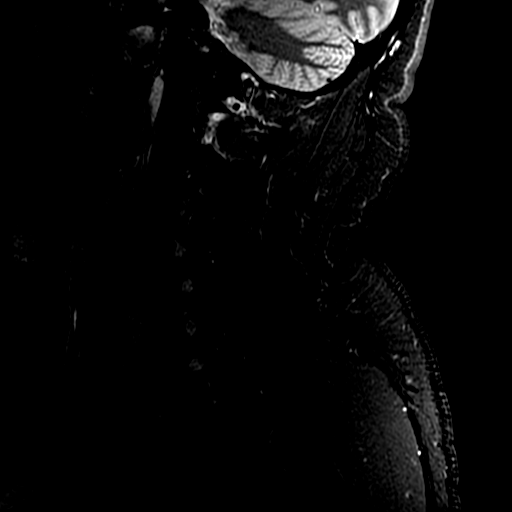
[im 4/7]
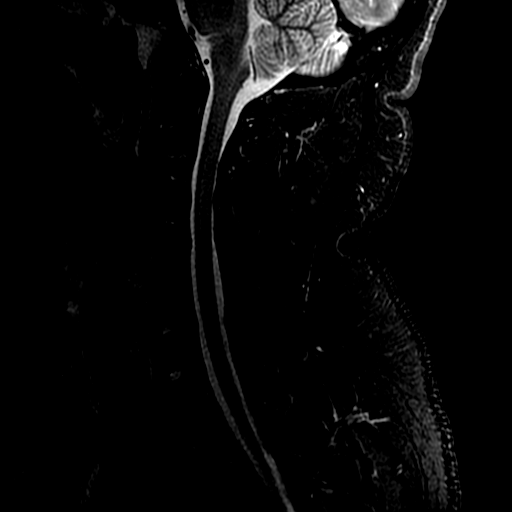
[im 7/7]
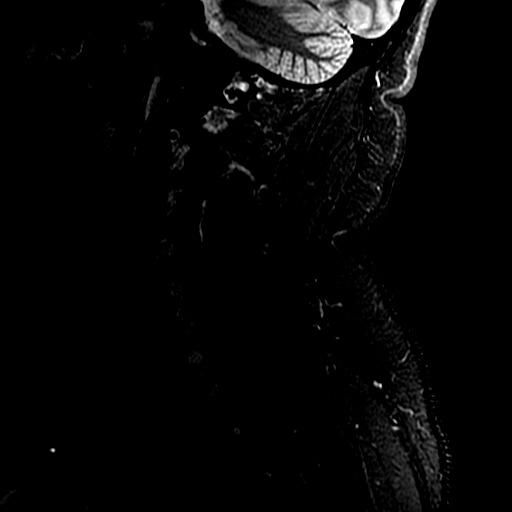

[Series 6: T2 · axial · 3.0mm · 0.39mm/px · z∈[-135,-19]mm · 8 of 41 slices shown (2 of 2)]
[im 1/41]
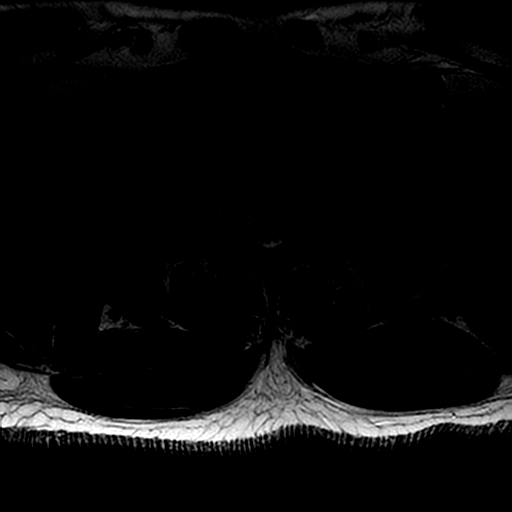
[im 6/41]
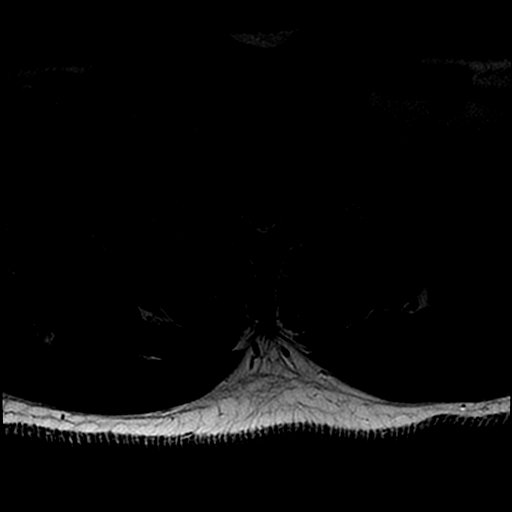
[im 12/41]
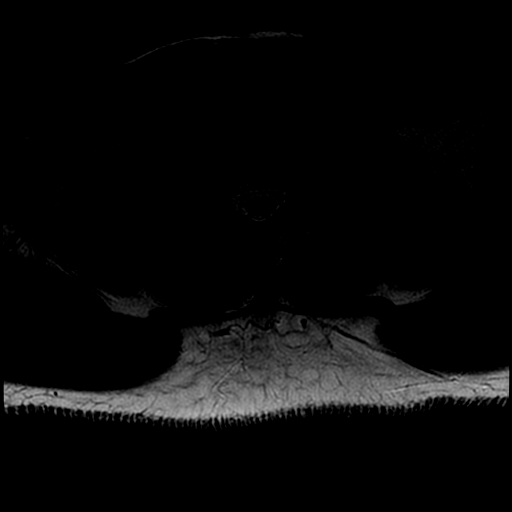
[im 18/41]
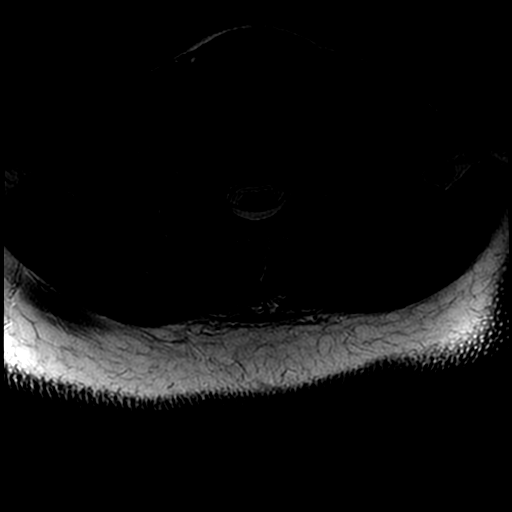
[im 21/41]
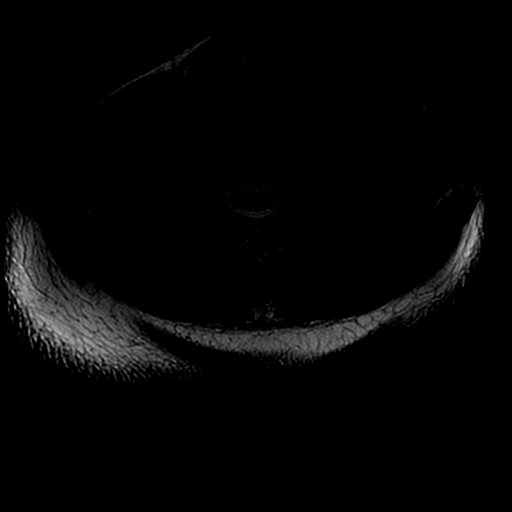
[im 23/41]
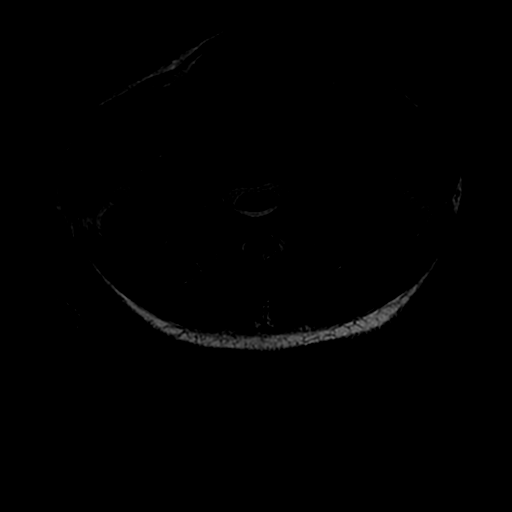
[im 29/41]
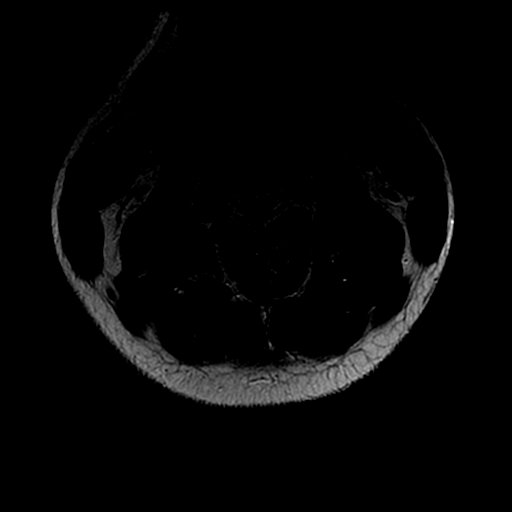
[im 35/41]
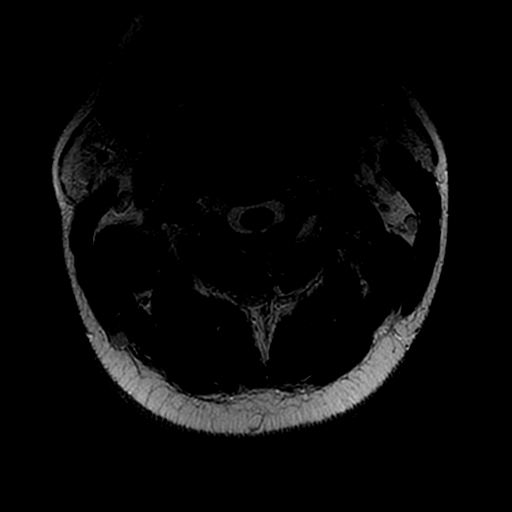

[19 of 48 positions shown; findings below may reference images not displayed]

FINDINGS: No marrow signal abnormality suggestive of fracture, infection, or
neoplasm. Partial non segmentation of the C5-6 vertebrae with
adjacent level degenerative change noted below. Normal cord signal
and morphology. No notable extra-spinal findings.

Degenerative changes:

C2-3: Unremarkable.

C3-4: Minimal uncovertebral spurring without stenosis.

C4-5: Mild degenerative disc change with anterior annular fissure,
likely accelerated by subjacent non segmentation. There is mild
right uncovertebral spurring without significant foraminal stenosis.
Mild posterior disc bulging.

C5-6: Non segmentation with widely patent canal and foramina

C6-7: Small uncovertebral spurs without stenosis.

C7-T1:Unremarkable.
IMPRESSION: 1. Normal cervical cord.
2. Early uncovertebral spurring above and below C5-6 non
segmentation. No significant canal or foraminal stenosis.

## 2017-01-16 ENCOUNTER — Emergency Department (HOSPITAL_COMMUNITY): Payer: Self-pay

## 2017-01-16 ENCOUNTER — Encounter (HOSPITAL_COMMUNITY): Payer: Self-pay | Admitting: Emergency Medicine

## 2017-01-16 ENCOUNTER — Emergency Department (HOSPITAL_COMMUNITY)
Admission: EM | Admit: 2017-01-16 | Discharge: 2017-01-17 | Disposition: A | Discharge status: Home / Self Care | Payer: Self-pay | Attending: Emergency Medicine | Admitting: Emergency Medicine

## 2017-01-16 DIAGNOSIS — Z79899 Other long term (current) drug therapy: Secondary | ICD-10-CM | POA: Insufficient documentation

## 2017-01-16 DIAGNOSIS — I10 Essential (primary) hypertension: Secondary | ICD-10-CM | POA: Insufficient documentation

## 2017-01-16 DIAGNOSIS — R0789 Other chest pain: Secondary | ICD-10-CM | POA: Insufficient documentation

## 2017-01-16 LAB — BASIC METABOLIC PANEL
Anion gap: 10 (ref 5–15)
BUN: 9 mg/dL (ref 6–20)
CO2: 27 mmol/L (ref 22–32)
Calcium: 9.5 mg/dL (ref 8.9–10.3)
Chloride: 100 mmol/L — ABNORMAL LOW (ref 101–111)
Creatinine, Ser: 1.04 mg/dL (ref 0.61–1.24)
GLUCOSE: 98 mg/dL (ref 65–99)
POTASSIUM: 3.6 mmol/L (ref 3.5–5.1)
Sodium: 137 mmol/L (ref 135–145)

## 2017-01-16 LAB — CBC
HCT: 38.7 % — ABNORMAL LOW (ref 39.0–52.0)
HEMOGLOBIN: 13.6 g/dL (ref 13.0–17.0)
MCH: 31.5 pg (ref 26.0–34.0)
MCHC: 35.1 g/dL (ref 30.0–36.0)
MCV: 89.6 fL (ref 78.0–100.0)
Platelets: 236 10*3/uL (ref 150–400)
RBC: 4.32 MIL/uL (ref 4.22–5.81)
RDW: 13.3 % (ref 11.5–15.5)
WBC: 4.5 10*3/uL (ref 4.0–10.5)

## 2017-01-16 LAB — POCT I-STAT TROPONIN I: TROPONIN I, POC: 0.01 ng/mL (ref 0.00–0.08)

## 2017-01-16 NOTE — ED Provider Notes (Signed)
[facility]-EMERGENCY DEPT Provider Note   CSN: [id number] Arrival date & time: 01/16/17  2113  By signing my name below, I, Deland PrettySherilynn Knight, attest that this documentation has been prepared under the direction and in the presence of Melene PlanFloyd, Serigne Kubicek, DO. Electronically Signed: Deland PrettySherilynn Knight, ED Scribe. 01/16/17. 11:42 PM.  History   Chief Complaint Chief Complaint  Patient presents with  . Chest Pain   The history is provided by the patient. No language interpreter was used.  Illness  This is a new problem. The current episode started 3 to 5 hours ago. The problem occurs constantly. The problem has not changed since onset.Associated symptoms include chest pain. Pertinent negatives include no abdominal pain, no headaches and no shortness of breath. Nothing aggravates the symptoms. Nothing relieves the symptoms. He has tried nothing for the symptoms. The treatment provided no relief.    HPI Comments: Ian Thomas is a 36 y.o. male who presents to the Emergency Department complaining of gradually resolving "throbbing" mild left central chest pain with associated left upper arm pain that began at 6:00pm today. The pt states that his arm pain was the most severe. The pt has a h/x of HTN, and HLD, with a FHx of DM. He denies a h/x of PE, smoking, recent surgeries, and recent international travel. The pt denies leg swelling, abdominal pain, and vomiting.  Past Medical History:  Diagnosis Date  . Hyperlipidemia 11/22/2014  . Hypertension     Patient Active Problem List   Diagnosis Date Noted  . Toe infection 12/14/2014  . Hyperlipidemia 11/22/2014  . Tinea pedis 11/22/2014  . Sinusitis, chronic 11/22/2014  . Headache 11/08/2014  . HTN (hypertension) 11/03/2014  . Generalized weakness   . Weakness of both legs 11/01/2014  . Achilles tendinitis 01/13/2013    Past Surgical History:  Procedure Laterality Date  . KNEE SURGERY Right 1998       Home Medications    Prior to Admission  medications   Medication Sig Start Date End Date Taking? Authorizing Provider  lisinopril-hydrochlorothiazide (ZESTORETIC) 20-12.5 MG per tablet Take 1 tablet by mouth daily. 11/03/14  Yes Calvert Cantorizwan, Saima, MD  fluticasone (FLONASE) 50 MCG/ACT nasal spray Place 1 spray into both nostrils daily. Patient not taking: Reported on 12/14/2014 11/22/14   Jaclyn ShaggyAmao, Enobong, MD  naproxen (NAPROSYN) 500 MG tablet Take 1 tablet (500 mg total) by mouth 2 (two) times daily with a meal. Patient not taking: Reported on 01/16/2017 11/25/14   Henrietta HooverBernhardt, Linda C, NP  pravastatin (PRAVACHOL) 20 MG tablet Take 1 tablet (20 mg total) by mouth daily. Patient not taking: Reported on 01/16/2017 11/09/14   Jaclyn ShaggyAmao, Enobong, MD  terbinafine (LAMISIL AT) 1 % cream Apply 1 application topically 2 (two) times daily. Patient not taking: Reported on 01/16/2017 11/22/14   Jaclyn ShaggyAmao, Enobong, MD    Family History Family History  Problem Relation Age of Onset  . Hypertension Mother   . Hypertension Father   . Diabetes Sister   . Heart attack Neg Hx   . Hyperlipidemia Neg Hx   . Sudden death Neg Hx     Social History Social History  Substance Use Topics  . Smoking status: Never Smoker  . Smokeless tobacco: Never Used  . Alcohol use No     Allergies   Patient has no known allergies.   Review of Systems Review of Systems  Constitutional: Negative for chills, diaphoresis and fever.  HENT: Negative for congestion and facial swelling.   Eyes: Negative for discharge and visual  disturbance.  Respiratory: Negative for shortness of breath.   Cardiovascular: Positive for chest pain. Negative for palpitations and leg swelling.  Gastrointestinal: Negative for abdominal pain, diarrhea and vomiting.  Genitourinary: Negative for hematuria.  Musculoskeletal: Positive for myalgias. Negative for arthralgias.  Skin: Negative for color change and rash.  Neurological: Negative for tremors, syncope and headaches.  Psychiatric/Behavioral: Negative  for confusion and dysphoric mood.     Physical Exam Updated Vital Signs BP (!) 143/104 (BP Location: Left Arm)   Pulse 69   Temp 98.6 F (37 C) (Oral)   Resp 14   SpO2 98%   Physical Exam  Constitutional: He is oriented to person, place, and time. He appears well-developed and well-nourished.  HENT:  Head: Normocephalic and atraumatic.  Eyes: Pupils are equal, round, and reactive to light. EOM are normal.  Neck: Normal range of motion. Neck supple. No JVD present.  Cardiovascular: Normal rate and regular rhythm.  Exam reveals no gallop and no friction rub.   No murmur heard. Pulmonary/Chest: No respiratory distress. He has no wheezes.  Abdominal: He exhibits no distension. There is no rebound and no guarding.  Musculoskeletal: Normal range of motion.  Neurological: He is alert and oriented to person, place, and time.  Skin: No rash noted. No pallor.  Psychiatric: He has a normal mood and affect. His behavior is normal.  Nursing note and vitals reviewed.    ED Treatments / Results   DIAGNOSTIC STUDIES: Oxygen Saturation is 97% on RA, normal by my interpretation.   COORDINATION OF CARE: 11:34 PM-Discussed next steps with pt. Pt verbalized understanding and is agreeable with the plan.   Labs (all labs ordered are listed, but only abnormal results are displayed) Labs Reviewed  BASIC METABOLIC PANEL - Abnormal; Notable for the following:       Result Value   Chloride 100 (*)    All other components within normal limits  CBC - Abnormal; Notable for the following:    HCT 38.7 (*)    All other components within normal limits  I-STAT TROPONIN, ED  POCT I-STAT TROPONIN I    EKG  EKG Interpretation  Date/Time:  Wednesday January 16 2017 21:27:33 EDT Ventricular Rate:  82 PR Interval:  148 QRS Duration: 92 QT Interval:  366 QTC Calculation: 427 R Axis:   22 Text Interpretation:  Normal sinus rhythm Normal ECG No significant change since last tracing Confirmed by  Melene Plan [phone]) on 01/16/2017 11:02:19 PM       Radiology Dg Chest 2 View  Result Date: 01/16/2017 CLINICAL DATA:  36 year old male with chest pain. EXAM: CHEST  2 VIEW COMPARISON:  Chest radiograph dated 11/01/2014 FINDINGS: The heart size and mediastinal contours are within normal limits. Both lungs are clear. The visualized skeletal structures are unremarkable. IMPRESSION: No active cardiopulmonary disease. Electronically Signed   By: Elgie Collard M.D.   On: 01/16/2017 21:57    Procedures Procedures (including critical care time)  Medications Ordered in ED Medications - No data to display   Initial Impression / Assessment and Plan / ED Course  I have reviewed the triage vital signs and the nursing notes.  Pertinent labs & imaging results that were available during my care of the patient were reviewed by me and considered in my medical decision making (see chart for details).     37 yo M with a chief complaint of chest pain. This is pinpoint sharp. Going on for the past 6-12 hours or so. Nonexertional. No  shortness of breath associated with it. I am unable to reproduce this suspected muscular skeletal with its pinpoint nature. EKG troponin chest x-ray unremarkable. With the patient's age and history being completely atypical of a ACS I would not have ordered a troponin. At this point I feel he is safe for discharge. PCP follow-up.  1:34 AM:  I have discussed the diagnosis/risks/treatment options with the patient and family and believe the pt to be eligible for discharge home to follow-up with PCP. We also discussed returning to the ED immediately if new or worsening sx occur. We discussed the sx which are most concerning (e.g., sudden worsening pain, fever, inability to tolerate by mouth) that necessitate immediate return. Medications administered to the patient during their visit and any new prescriptions provided to the patient are listed below.  Medications given during this  visit Medications - No data to display   The patient appears reasonably screen and/or stabilized for discharge and I doubt any other medical condition or other [facility] requiring further screening, evaluation, or treatment in the ED at this time prior to discharge.    Final Clinical Impressions(s) / ED Diagnoses   Final diagnoses:  Atypical chest pain    New Prescriptions Discharge Medication List as of 01/16/2017 11:43 PM      I personally performed the services described in this documentation, which was scribed in my presence. The recorded information has been reviewed and is accurate.     Melene Plan, DO 01/17/17 [phone]

## 2017-01-16 NOTE — Discharge Instructions (Signed)
Take 4 over the counter ibuprofen tablets 3 times a day or 2 over-the-counter naproxen tablets twice a day for pain. Also take tylenol 1000mg(2 extra strength) four times a day.    

## 2017-01-16 NOTE — ED Triage Notes (Signed)
Pt states he started having chest and left arm pain that started tonight around 6pm and got worse around 8 pm  Pt states he has some light headedness and a headache  Pt states his blood pressure is elevated too
